# Patient Record
Sex: Male | Born: 1970 | Race: White | Hispanic: No | Marital: Married | Smoking: Never smoker
Health system: Southern US, Community
[De-identification: ages and names within clinical notes are randomized; demographics above are authoritative.]

## PROBLEM LIST (undated history)

## (undated) DIAGNOSIS — E039 Hypothyroidism, unspecified: Secondary | ICD-10-CM

## (undated) DIAGNOSIS — F909 Attention-deficit hyperactivity disorder, unspecified type: Secondary | ICD-10-CM

## (undated) DIAGNOSIS — K429 Umbilical hernia without obstruction or gangrene: Secondary | ICD-10-CM

## (undated) DIAGNOSIS — I82409 Acute embolism and thrombosis of unspecified deep veins of unspecified lower extremity: Secondary | ICD-10-CM

## (undated) HISTORY — PX: NO PAST SURGERIES: SHX2092

## (undated) HISTORY — DX: Hypothyroidism, unspecified: E03.9

## (undated) HISTORY — DX: Acute embolism and thrombosis of unspecified deep veins of unspecified lower extremity: I82.409

---

## 2011-06-27 DIAGNOSIS — I8003 Phlebitis and thrombophlebitis of superficial vessels of lower extremities, bilateral: Secondary | ICD-10-CM

## 2011-06-27 DIAGNOSIS — E038 Other specified hypothyroidism: Secondary | ICD-10-CM

## 2011-06-27 DIAGNOSIS — I82409 Acute embolism and thrombosis of unspecified deep veins of unspecified lower extremity: Secondary | ICD-10-CM

## 2011-06-27 DIAGNOSIS — E063 Autoimmune thyroiditis: Secondary | ICD-10-CM

## 2011-06-27 DIAGNOSIS — I2699 Other pulmonary embolism without acute cor pulmonale: Secondary | ICD-10-CM

## 2011-06-27 HISTORY — DX: Autoimmune thyroiditis: E06.3

## 2011-06-27 HISTORY — DX: Acute embolism and thrombosis of unspecified deep veins of unspecified lower extremity: I82.409

## 2011-06-27 HISTORY — DX: Other specified hypothyroidism: E03.8

## 2011-06-27 HISTORY — DX: Phlebitis and thrombophlebitis of superficial vessels of lower extremities, bilateral: I80.03

## 2011-06-27 HISTORY — DX: Other pulmonary embolism without acute cor pulmonale: I26.99

## 2012-03-25 ENCOUNTER — Other Ambulatory Visit: Payer: Self-pay

## 2012-03-25 ENCOUNTER — Encounter (HOSPITAL_COMMUNITY): Payer: Self-pay | Admitting: Family Medicine

## 2012-03-25 ENCOUNTER — Emergency Department (HOSPITAL_COMMUNITY): Payer: BC Managed Care – PPO

## 2012-03-25 ENCOUNTER — Inpatient Hospital Stay (HOSPITAL_COMMUNITY)
Admission: EM | Admit: 2012-03-25 | Discharge: 2012-03-30 | DRG: 543 | Disposition: A | Discharge status: Home / Self Care | Payer: BC Managed Care – PPO | Attending: Internal Medicine | Admitting: Internal Medicine

## 2012-03-25 DIAGNOSIS — D72829 Elevated white blood cell count, unspecified: Secondary | ICD-10-CM

## 2012-03-25 DIAGNOSIS — I8000 Phlebitis and thrombophlebitis of superficial vessels of unspecified lower extremity: Secondary | ICD-10-CM | POA: Diagnosis present

## 2012-03-25 DIAGNOSIS — Z87891 Personal history of nicotine dependence: Secondary | ICD-10-CM

## 2012-03-25 DIAGNOSIS — I809 Phlebitis and thrombophlebitis of unspecified site: Secondary | ICD-10-CM

## 2012-03-25 DIAGNOSIS — R161 Splenomegaly, not elsewhere classified: Secondary | ICD-10-CM | POA: Diagnosis present

## 2012-03-25 DIAGNOSIS — R21 Rash and other nonspecific skin eruption: Secondary | ICD-10-CM

## 2012-03-25 DIAGNOSIS — I2699 Other pulmonary embolism without acute cor pulmonale: Secondary | ICD-10-CM

## 2012-03-25 DIAGNOSIS — I824Y9 Acute embolism and thrombosis of unspecified deep veins of unspecified proximal lower extremity: Principal | ICD-10-CM | POA: Diagnosis present

## 2012-03-25 DIAGNOSIS — I82419 Acute embolism and thrombosis of unspecified femoral vein: Secondary | ICD-10-CM

## 2012-03-25 DIAGNOSIS — F909 Attention-deficit hyperactivity disorder, unspecified type: Secondary | ICD-10-CM

## 2012-03-25 DIAGNOSIS — I82409 Acute embolism and thrombosis of unspecified deep veins of unspecified lower extremity: Secondary | ICD-10-CM

## 2012-03-25 DIAGNOSIS — I824Z9 Acute embolism and thrombosis of unspecified deep veins of unspecified distal lower extremity: Secondary | ICD-10-CM | POA: Diagnosis present

## 2012-03-25 HISTORY — DX: Attention-deficit hyperactivity disorder, unspecified type: F90.9

## 2012-03-25 LAB — CBC WITH DIFFERENTIAL/PLATELET
Basophils Absolute: 0.1 10*3/uL (ref 0.0–0.1)
Eosinophils Absolute: 0.5 10*3/uL (ref 0.0–0.7)
Eosinophils Relative: 4 % (ref 0–5)
HCT: 39 % (ref 39.0–52.0)
MCH: 28.3 pg (ref 26.0–34.0)
MCHC: 33.6 g/dL (ref 30.0–36.0)
MCV: 84.2 fL (ref 78.0–100.0)
Monocytes Absolute: 1 10*3/uL (ref 0.1–1.0)
Platelets: 373 10*3/uL (ref 150–400)
RDW: 14.5 % (ref 11.5–15.5)
WBC: 12.4 10*3/uL — ABNORMAL HIGH (ref 4.0–10.5)

## 2012-03-25 LAB — COMPREHENSIVE METABOLIC PANEL
ALT: 17 U/L (ref 0–53)
AST: 20 U/L (ref 0–37)
CO2: 26 mEq/L (ref 19–32)
Calcium: 9.6 mg/dL (ref 8.4–10.5)
Creatinine, Ser: 1.18 mg/dL (ref 0.50–1.35)
GFR calc Af Amer: 88 mL/min — ABNORMAL LOW (ref >90)
GFR calc non Af Amer: 76 mL/min — ABNORMAL LOW (ref >90)
Sodium: 137 mEq/L (ref 135–145)
Total Protein: 7.6 g/dL (ref 6.0–8.3)

## 2012-03-25 LAB — TYPE AND SCREEN: Antibody Screen: NEGATIVE

## 2012-03-25 LAB — PROTIME-INR: Prothrombin Time: 14.8 seconds (ref 11.6–15.2)

## 2012-03-25 LAB — APTT: aPTT: 46 seconds — ABNORMAL HIGH (ref 24–37)

## 2012-03-25 LAB — ABO/RH: ABO/RH(D): O POS

## 2012-03-25 MED ORDER — CLONAZEPAM 0.5 MG PO TABS
0.5000 mg | ORAL_TABLET | Freq: Every day | ORAL | Status: DC
  Filled 2012-03-25 (×3): qty 1

## 2012-03-25 MED ORDER — ONDANSETRON HCL 4 MG/2ML IJ SOLN: 4.0000 mg | Freq: Four times a day (QID) | INTRAMUSCULAR | Status: DC | PRN

## 2012-03-25 MED ORDER — SODIUM CHLORIDE 0.9 % IV SOLN
INTRAVENOUS | Status: DC
  Administered 2012-03-26 (×2): via INTRAVENOUS
  Administered 2012-03-27: 75 mL/h via INTRAVENOUS
  Administered 2012-03-27: 23:00:00 via INTRAVENOUS
  Administered 2012-03-28: 1000 mL via INTRAVENOUS
  Administered 2012-03-29 (×2): via INTRAVENOUS

## 2012-03-25 MED ORDER — ACETAMINOPHEN 325 MG PO TABS: 650.0000 mg | ORAL_TABLET | Freq: Four times a day (QID) | ORAL | Status: DC | PRN

## 2012-03-25 MED ORDER — AMPHETAMINE-DEXTROAMPHET ER 10 MG PO CP24
50.0000 mg | ORAL_CAPSULE | Freq: Every day | ORAL | Status: DC
  Filled 2012-03-25: qty 5

## 2012-03-25 MED ORDER — ACETAMINOPHEN 650 MG RE SUPP: 650.0000 mg | Freq: Four times a day (QID) | RECTAL | Status: DC | PRN

## 2012-03-25 MED ORDER — SODIUM CHLORIDE 0.9 % IJ SOLN
3.0000 mL | Freq: Two times a day (BID) | INTRAMUSCULAR | Status: DC
  Administered 2012-03-25 – 2012-03-29 (×3): 3 mL via INTRAVENOUS

## 2012-03-25 MED ORDER — HEPARIN (PORCINE) IN NACL 100-0.45 UNIT/ML-% IJ SOLN
2100.0000 [IU]/h | INTRAMUSCULAR | Status: DC
  Administered 2012-03-25: 1700 [IU]/h via INTRAVENOUS
  Administered 2012-03-26 (×2): 2100 [IU]/h via INTRAVENOUS
  Filled 2012-03-25 (×3): qty 250

## 2012-03-25 MED ORDER — ONDANSETRON HCL 4 MG PO TABS: 4.0000 mg | ORAL_TABLET | Freq: Four times a day (QID) | ORAL | Status: DC | PRN

## 2012-03-25 MED ORDER — IOHEXOL 350 MG/ML SOLN
100.0000 mL | Freq: Once | INTRAVENOUS | Status: AC | PRN
  Administered 2012-03-25: 100 mL via INTRAVENOUS

## 2012-03-25 MED ORDER — HEPARIN BOLUS VIA INFUSION
5000.0000 [IU] | Freq: Once | INTRAVENOUS | Status: AC
  Administered 2012-03-25: 5000 [IU] via INTRAVENOUS

## 2012-03-25 MED ORDER — SODIUM CHLORIDE 0.9 % IV SOLN
INTRAVENOUS | Status: AC
  Administered 2012-03-25: 1000 mL via INTRAVENOUS

## 2012-03-25 NOTE — Consult Note (Signed)
ANTICOAGULATION CONSULT NOTE - Initial Consult  Pharmacy Consult for Heparin Indication: Bilateral DVT and PE  No Known Allergies  Patient Measurements: Height: 6\' 1"  (185.4 cm) Weight: 222 lb (100.699 kg) IBW/kg (Calculated) : 79.9  Heparin Dosing Weight:100 kg  Vital Signs: Temp: 98.6 F (37 C) (09/30 1434) Temp src: Oral (09/30 1434) BP: 112/71 mmHg (09/30 1631) Pulse Rate: 75  (09/30 1631)  Labs:  Basename 03/25/12 1511 03/25/12 1441  HGB -- 13.1  HCT -- 39.0  PLT -- 373  APTT 46* --  LABPROT 14.8 --  INR 1.18 --  HEPARINUNFRC -- --  CREATININE -- 1.18  CKTOTAL -- --  CKMB -- --  TROPONINI -- --    Estimated Creatinine Clearance: 104.9 ml/min (by C-G formula based on Cr of 1.18).   Medical History: Past Medical History  Diagnosis Date  . ADHD (attention deficit hyperactivity disorder)    Assessment: 41yom to begin heparin for bilateral DVT and PE. Baseline CBC and renal function wnl.   Goal of Therapy:  Heparin level 0.3-0.7 units/ml Monitor platelets by anticoagulation protocol: Yes   Plan:  1) Heparin bolus 5000 units x 1 2) Heparin drip at 1700 units/hr 3) 6h heparin level 4) Daily heparin level and CBC  Fredrik Rigger 03/25/2012,8:28 PM

## 2012-03-25 NOTE — H&P (Signed)
Andrew Clayton is an 41 y.o. male.   Patient was seen and examined on March 25, 2012. PCP - Dr. Merri Brunette. Chief Complaint: Lower extremity DVT. HPI: 41 year old male with history of ADHD was referred to the ER after patient's PCP had found patient had DVT. Patient has been having some pain and swelling like knots in his left leg over the last few days. Since it worsened patient had gone to his PCP and Doppler was done which showed DVT. In the ER patient had repeat Dopplers done bilaterally which showed extensive DVT of both lower extremity. ER physician Dr. Ignacia Palma had contacted pulmonary critical care Dr. Tyson Alias who advised patient to have CT angiogram done which shows bilateral pulmonary embolism. Patient at this time is hemodynamically stable. Denies any chest pain or shortness of breath. 2 weeks ago he had noticed some lumps in his left upper extremity. And over the last one year he has been explaining off and on dizziness but denies any loss of consciousness. As the ER physician Dr. Tyson Alias has requested patient to be observed in step down. Patient has been started IV heparin.  Past Medical History  Diagnosis Date  . ADHD (attention deficit hyperactivity disorder)     Past Surgical History  Procedure Date  . No past surgeries     Family History  Problem Relation Age of Onset  . Pulmonary embolism Neg Hx    Social History:  reports that he has never smoked. He does not have any smokeless tobacco history on file. He reports that he does not drink alcohol. His drug history not on file.  Allergies: No Known Allergies   (Not in a hospital admission)  Results for orders placed during the hospital encounter of 03/25/12 (from the past 48 hour(s))  CBC WITH DIFFERENTIAL     Status: Abnormal   Collection Time   03/25/12  2:41 PM      Component Value Range Comment   WBC 12.4 (*) 4.0 - 10.5 K/uL    RBC 4.63  4.22 - 5.81 MIL/uL    Hemoglobin 13.1  13.0 - 17.0 g/dL    HCT 16.1   09.6 - 04.5 %    MCV 84.2  78.0 - 100.0 fL    MCH 28.3  26.0 - 34.0 pg    MCHC 33.6  30.0 - 36.0 g/dL    RDW 40.9  81.1 - 91.4 %    Platelets 373  150 - 400 K/uL    Neutrophils Relative 72  43 - 77 %    Neutro Abs 8.9 (*) 1.7 - 7.7 K/uL    Lymphocytes Relative 16  12 - 46 %    Lymphs Abs 2.0  0.7 - 4.0 K/uL    Monocytes Relative 8  3 - 12 %    Monocytes Absolute 1.0  0.1 - 1.0 K/uL    Eosinophils Relative 4  0 - 5 %    Eosinophils Absolute 0.5  0.0 - 0.7 K/uL    Basophils Relative 1  0 - 1 %    Basophils Absolute 0.1  0.0 - 0.1 K/uL   COMPREHENSIVE METABOLIC PANEL     Status: Abnormal   Collection Time   03/25/12  2:41 PM      Component Value Range Comment   Sodium 137  135 - 145 mEq/L    Potassium 3.9  3.5 - 5.1 mEq/L    Chloride 100  96 - 112 mEq/L    CO2 26  19 -  32 mEq/L    Glucose, Bld 79  70 - 99 mg/dL    BUN 17  6 - 23 mg/dL    Creatinine, Ser 1.61  0.50 - 1.35 mg/dL    Calcium 9.6  8.4 - 09.6 mg/dL    Total Protein 7.6  6.0 - 8.3 g/dL    Albumin 3.8  3.5 - 5.2 g/dL    AST 20  0 - 37 U/L    ALT 17  0 - 53 U/L    Alkaline Phosphatase 70  39 - 117 U/L    Total Bilirubin 0.7  0.3 - 1.2 mg/dL    GFR calc non Af Amer 76 (*) >90 mL/min    GFR calc Af Amer 88 (*) >90 mL/min   PROTIME-INR     Status: Normal   Collection Time   03/25/12  3:11 PM      Component Value Range Comment   Prothrombin Time 14.8  11.6 - 15.2 seconds    INR 1.18  0.00 - 1.49   APTT     Status: Abnormal   Collection Time   03/25/12  3:11 PM      Component Value Range Comment   aPTT 46 (*) 24 - 37 seconds   TYPE AND SCREEN     Status: Normal   Collection Time   03/25/12  3:28 PM      Component Value Range Comment   ABO/RH(D) O POS      Antibody Screen NEG      Sample Expiration 03/28/2012     ABO/RH     Status: Normal   Collection Time   03/25/12  3:28 PM      Component Value Range Comment   ABO/RH(D) O POS      Ct Angio Chest Pe W/cm &/or Wo Cm  03/25/2012  *RADIOLOGY REPORT*  Clinical  Data: Known DVT with chest pain shortness of breath.  CT ANGIOGRAPHY CHEST  Technique:  Multidetector CT imaging of the chest using the standard protocol during bolus administration of intravenous contrast. Multiplanar reconstructed images including MIPs were obtained and reviewed to evaluate the vascular anatomy.  Contrast: OMNIPAQUE IOHEXOL 350 MG/ML SOLN  Comparison: None.  Findings: The study is limited by patient breathing motion throughout image acquisition.  There is no large central pulmonary embolus in the main pulmonary arteries are low or pulmonary arteries.  However tiny apparent filling defects are seen in subsegmental pulmonary arteries to both lower lobes (see posteromedial right lower lobe on image 197 and posterior left lower lobe on image 180, both of series 8).  No thoracic aortic aneurysm.  No dissection of the thoracic aorta. Heart size is normal.  There is no pericardial or pleural effusion.  No axillary R bowel, normal in mediastinal, or hilar lymphadenopathy.  The lung windows show some dependent atelectasis in the lower lobes.  There is a 5 mm pulmonary nodule in the right lower lobe on image 49 of series 7.  Bone windows reveal no worrisome lytic or sclerotic osseous lesions.  IMPRESSION: Tiny filling defects identified and subsegmental pulmonary arteries to both lower lobes, consist with acute pulmonary embolus.  5 mm right lower lobe pulmonary nodule. If the patient is at high risk for bronchogenic carcinoma, follow-up chest CT at 6-12 months is recommended.  If the patient is at low risk for bronchogenic carcinoma, follow-up chest CT at 12 months is recommended.  This recommendation follows the consensus statement: Guidelines for Management of Small Pulmonary Nodules  Detected on CT Scans: A Statement from the Fleischner Society as published in Radiology 2005; 237:395-400.  Critical Value/emergent results were called by telephone at the time of interpretation on 03/25/2012 at 1930  hours by me to Dr. Ignacia Palma, who verbally acknowledged these results.  .   Original Report Authenticated By: ERIC A. MANSELL, M.D.     Review of Systems  Constitutional: Negative.   HENT: Negative.   Eyes: Negative.   Respiratory: Negative.   Cardiovascular: Negative.   Gastrointestinal: Negative.   Genitourinary: Negative.   Musculoskeletal:       Lower extremity pain and swelling.  Skin: Negative.   Neurological: Negative.   Endo/Heme/Allergies: Negative.   Psychiatric/Behavioral: Negative.     Blood pressure 112/71, pulse 75, temperature 98.6 F (37 C), temperature source Oral, resp. rate 15, height 6\' 1"  (1.854 m), weight 100.699 kg (222 lb), SpO2 98.00%. Physical Exam  Constitutional: He is oriented to person, place, and time. He appears well-developed and well-nourished. No distress.  HENT:  Head: Normocephalic and atraumatic.  Right Ear: External ear normal.  Left Ear: External ear normal.  Nose: Nose normal.  Mouth/Throat: Oropharynx is clear and moist. No oropharyngeal exudate.  Eyes: Conjunctivae normal are normal. Pupils are equal, round, and reactive to light. Right eye exhibits no discharge. Left eye exhibits no discharge. No scleral icterus.  Neck: Normal range of motion. Neck supple.  Cardiovascular: Normal rate and regular rhythm.   Respiratory: Effort normal and breath sounds normal. No respiratory distress. He has no wheezes. He has no rales.  GI: Soft. Bowel sounds are normal. He exhibits no distension. There is no tenderness. There is no rebound.  Musculoskeletal:       Erythema in lower extremity more on the left side.  Neurological: He is alert and oriented to person, place, and time.       Moves all extremities.  Skin: He is not diaphoretic. There is erythema.  Psychiatric: His behavior is normal.     Assessment/Plan #1. Bilateral pulmonary embolism with extensive DVT of lower extremity - patient has been started on IV heparin. If patient continues to  be hemodynamically stable may change to  xarelto. At this time is we do not know the exact cause for his DVT and PE but will need further workup as outpatient through his primary care. #2. Pulmonary nodule - repeat CT chest in 6-12 months as recommended by radiologist.  CODE STATUS - full code.  Eduard Clos 03/25/2012, 8:31 PM

## 2012-03-25 NOTE — ED Provider Notes (Signed)
History     CSN: [id number]  Arrival date & time 03/25/12  1430   First MD Initiated Contact with Patient 03/25/12 1503      Chief Complaint  Patient presents with  . DVT    (Consider location/radiation/quality/duration/timing/severity/associated sxs/prior treatment) HPI Comments: It is a 41 year old man has been having pain in his arms and legs for several weeks on and off. It got worse last Thursday, 4 days ago, when he developed pain in the left calf and left thigh. Today he saw his internist, Merri Brunette M.D., who ordered a DVT study which was positive. He was therefore sent to [facility] [place] for further evaluation and treatment.  Patient is a 41 y.o. male presenting with leg pain. The history is provided by the patient. No language interpreter was used.  Leg Pain  The incident occurred more than 2 days ago. There was no injury mechanism. The pain is present in the left thigh (Left calf). The quality of the pain is described as aching. The pain is moderate. The pain has been constant since onset. He reports no foreign bodies present. He has tried nothing for the symptoms.    History reviewed. No pertinent past medical history.  History reviewed. No pertinent past surgical history.  History reviewed. No pertinent family history.  History  Substance Use Topics  . Smoking status: Never Smoker   . Smokeless tobacco: Not on file  . Alcohol Use: No      Review of Systems  Constitutional: Negative.  Negative for fever and chills.  HENT: Negative.   Eyes: Negative.   Respiratory: Negative.   Cardiovascular: Negative.   Gastrointestinal: Negative.   Genitourinary: Negative.   Musculoskeletal:       Pain in left calf and left thigh.  Neurological: Negative.   Hematological: Negative for adenopathy. Does not bruise/bleed easily.  Psychiatric/Behavioral: Negative.     Allergies  Review of patient's allergies indicates no known allergies.  Home Medications   Current  Outpatient Rx  Name Route Sig Dispense Refill  . AMPHETAMINE-DEXTROAMPHET ER 25 MG PO CP24 Oral Take 50 mg by mouth daily.    Marland Kitchen CLONAZEPAM 0.5 MG PO TABS Oral Take 0.5 mg by mouth daily.    . IBUPROFEN 800 MG PO TABS Oral Take 800 mg by mouth every 8 (eight) hours as needed. For pain      BP 118/64  Pulse 88  Temp 98.6 F (37 C) (Oral)  Resp 16  SpO2 99%  Physical Exam  Nursing note and vitals reviewed. Constitutional: He appears well-developed and well-nourished. No distress.  HENT:  Head: Normocephalic and atraumatic.  Right Ear: External ear normal.  Left Ear: External ear normal.  Mouth/Throat: Oropharynx is clear and moist.  Eyes: Conjunctivae normal and EOM are normal. Pupils are equal, round, and reactive to light.  Neck: Normal range of motion. Neck supple.  Cardiovascular: Normal rate, regular rhythm and normal heart sounds.   Pulmonary/Chest: Effort normal and breath sounds normal.  Abdominal: Soft. Bowel sounds are normal.  Musculoskeletal: Normal range of motion.       He localizes pain to the left medial thigh, along the course of Hunter's canal.  There is no redness, warmth or point of tenderness.   Skin: Skin is warm and dry.  Psychiatric: He has a normal mood and affect. His behavior is normal.    ED Course  CRITICAL CARE Performed by: Osvaldo Human Authorized by: Osvaldo Human Total critical care time: 37  minutes Critical care was necessary to treat or prevent imminent or life-threatening deterioration of the following conditions: Evaluation and treatment of pt with bilateral lower leg deep venous thrombosis and bilateral lower lobe pulmonary emboli.  Critical care was time spent personally by me on the following activities: discussions with consultants, evaluation of patient's response to treatment, examination of patient, obtaining history from patient or surrogate, ordering and performing treatments and interventions, ordering and review of  laboratory studies, ordering and review of radiographic studies and re-evaluation of patient's condition.   (including critical care time)   Labs Reviewed  CBC WITH DIFFERENTIAL  COMPREHENSIVE METABOLIC PANEL  TYPE AND SCREEN  PROTIME-INR  APTT   3:10 PM Pt seen --> physical exam performed.  Lab workup ordered.  Griffin Basil, RN, DVT study nurse, introduced to pt to see if he wanted to be in a participant in a DVT study.    5:20 PM Results for orders placed during the hospital encounter of 03/25/12  CBC WITH DIFFERENTIAL      Component Value Range   WBC 12.4 (*) 4.0 - 10.5 K/uL   RBC 4.63  4.22 - 5.81 MIL/uL   Hemoglobin 13.1  13.0 - 17.0 g/dL   HCT 16.1  09.6 - 04.5 %   MCV 84.2  78.0 - 100.0 fL   MCH 28.3  26.0 - 34.0 pg   MCHC 33.6  30.0 - 36.0 g/dL   RDW 40.9  81.1 - 91.4 %   Platelets 373  150 - 400 K/uL   Neutrophils Relative 72  43 - 77 %   Neutro Abs 8.9 (*) 1.7 - 7.7 K/uL   Lymphocytes Relative 16  12 - 46 %   Lymphs Abs 2.0  0.7 - 4.0 K/uL   Monocytes Relative 8  3 - 12 %   Monocytes Absolute 1.0  0.1 - 1.0 K/uL   Eosinophils Relative 4  0 - 5 %   Eosinophils Absolute 0.5  0.0 - 0.7 K/uL   Basophils Relative 1  0 - 1 %   Basophils Absolute 0.1  0.0 - 0.1 K/uL  COMPREHENSIVE METABOLIC PANEL      Component Value Range   Sodium 137  135 - 145 mEq/L   Potassium 3.9  3.5 - 5.1 mEq/L   Chloride 100  96 - 112 mEq/L   CO2 26  19 - 32 mEq/L   Glucose, Bld 79  70 - 99 mg/dL   BUN 17  6 - 23 mg/dL   Creatinine, Ser 7.82  0.50 - 1.35 mg/dL   Calcium 9.6  8.4 - 95.6 mg/dL   Total Protein 7.6  6.0 - 8.3 g/dL   Albumin 3.8  3.5 - 5.2 g/dL   AST 20  0 - 37 U/L   ALT 17  0 - 53 U/L   Alkaline Phosphatase 70  39 - 117 U/L   Total Bilirubin 0.7  0.3 - 1.2 mg/dL   GFR calc non Af Amer 76 (*) >90 mL/min   GFR calc Af Amer 88 (*) >90 mL/min  TYPE AND SCREEN      Component Value Range   ABO/RH(D) O POS     Antibody Screen PENDING     Sample Expiration 03/28/2012      PROTIME-INR      Component Value Range   Prothrombin Time 14.8  11.6 - 15.2 seconds   INR 1.18  0.00 - 1.49  APTT      Component Value Range  aPTT 46 (*) 24 - 37 seconds     Date: 03/25/2012  Rate: 90  Rhythm: normal sinus rhythm  QRS Axis: normal  Intervals: normal  ST/T Wave abnormalities: normal  Conduction Disutrbances:none  Narrative Interpretation: Normal EKG  Old EKG Reviewed: none available  5:50 PM Pt seen by Micah Flesher of PCCM, who advised that pt needs to have repeat venous dopplers of both legs, IVC study for possible clot, and CT angio of chest to check for pulmonary embolism.  He will ultimately need admission to stepdown unit.  7:37 PM Report from Kennith Center, M.D., radiologist, says pt has small bilateral lower lobe pulmonary emboli.  Will call [facility] to admit him.    8:04 PM Case discussed with Dr. Toniann Fail.  Admit to Team 1 to [facility] unit, as advised Dr. Tyson Alias of Pulmonary and Critical Care Medicine.  1. Deep venous thrombosis of femoral vein   2. Pulmonary embolism           Carleene Cooper III, MD 03/25/12 2009     Carleene Cooper III, MD 03/25/12 2021

## 2012-03-25 NOTE — ED Notes (Signed)
Family at bedside. 

## 2012-03-25 NOTE — ED Notes (Signed)
Pt sent here for positive DVT in left leg.

## 2012-03-25 NOTE — Progress Notes (Signed)
VASCULAR LAB PRELIMINARY  PRELIMINARY  PRELIMINARY  PRELIMINARY  Bilateral lower extremity venous duplex completed.    Preliminary report:  Bilateral -  Positive for extensive DVT noted in the posterior tibial, popliteal, femoral, common femoral, and illiac veins. Also noted is a DVT of the right peroneal and left profunda vein. Positive for a superficial thrombus coursing throughout the entire right greater saphenous vein and graters saphenous vein of the left lower leg. Superficial thrombus noted in several smaller veins of the left thigh. All areas of thrombus indicate minimal to minute flow.  Kytzia Gienger, RVS 03/25/2012, 7:46 PM

## 2012-03-26 ENCOUNTER — Inpatient Hospital Stay (HOSPITAL_COMMUNITY): Payer: BC Managed Care – PPO

## 2012-03-26 ENCOUNTER — Encounter (HOSPITAL_COMMUNITY): Payer: Self-pay | Admitting: Radiology

## 2012-03-26 DIAGNOSIS — I824Z9 Acute embolism and thrombosis of unspecified deep veins of unspecified distal lower extremity: Secondary | ICD-10-CM

## 2012-03-26 DIAGNOSIS — F909 Attention-deficit hyperactivity disorder, unspecified type: Secondary | ICD-10-CM | POA: Diagnosis present

## 2012-03-26 DIAGNOSIS — R21 Rash and other nonspecific skin eruption: Secondary | ICD-10-CM | POA: Diagnosis present

## 2012-03-26 DIAGNOSIS — I809 Phlebitis and thrombophlebitis of unspecified site: Secondary | ICD-10-CM | POA: Diagnosis present

## 2012-03-26 DIAGNOSIS — I8010 Phlebitis and thrombophlebitis of unspecified femoral vein: Secondary | ICD-10-CM

## 2012-03-26 DIAGNOSIS — D72829 Elevated white blood cell count, unspecified: Secondary | ICD-10-CM | POA: Diagnosis present

## 2012-03-26 DIAGNOSIS — I2699 Other pulmonary embolism without acute cor pulmonale: Secondary | ICD-10-CM

## 2012-03-26 DIAGNOSIS — I82819 Embolism and thrombosis of superficial veins of unspecified lower extremities: Secondary | ICD-10-CM

## 2012-03-26 LAB — COMPREHENSIVE METABOLIC PANEL
AST: 23 U/L (ref 0–37)
Albumin: 3.3 g/dL — ABNORMAL LOW (ref 3.5–5.2)
Alkaline Phosphatase: 67 U/L (ref 39–117)
BUN: 16 mg/dL (ref 6–23)
CO2: 25 mEq/L (ref 19–32)
Chloride: 101 mEq/L (ref 96–112)
Creatinine, Ser: 1.18 mg/dL (ref 0.50–1.35)
GFR calc non Af Amer: 76 mL/min — ABNORMAL LOW (ref >90)
Potassium: 4.2 mEq/L (ref 3.5–5.1)
Total Bilirubin: 0.6 mg/dL (ref 0.3–1.2)

## 2012-03-26 LAB — HOMOCYSTEINE: Homocysteine: 12.9 umol/L (ref 4.0–15.4)

## 2012-03-26 LAB — CBC
HCT: 35.9 % — ABNORMAL LOW (ref 39.0–52.0)
MCV: 83.5 fL (ref 78.0–100.0)
Platelets: 374 10*3/uL (ref 150–400)
RBC: 4.3 MIL/uL (ref 4.22–5.81)
RDW: 14.5 % (ref 11.5–15.5)
WBC: 10.7 10*3/uL — ABNORMAL HIGH (ref 4.0–10.5)

## 2012-03-26 LAB — MRSA PCR SCREENING: MRSA by PCR: NEGATIVE

## 2012-03-26 LAB — TROPONIN I: Troponin I: <0.3 ng/mL (ref <0.30)

## 2012-03-26 LAB — OCCULT BLOOD X 1 CARD TO LAB, STOOL: Fecal Occult Bld: NEGATIVE

## 2012-03-26 LAB — SEDIMENTATION RATE: Sed Rate: 60 mm/hr — ABNORMAL HIGH (ref 0–16)

## 2012-03-26 LAB — HEPARIN LEVEL (UNFRACTIONATED): Heparin Unfractionated: <0.1 IU/mL — ABNORMAL LOW (ref 0.30–0.70)

## 2012-03-26 MED ORDER — HEPARIN BOLUS VIA INFUSION
2000.0000 [IU] | Freq: Once | INTRAVENOUS | Status: AC
  Administered 2012-03-26: 2000 [IU] via INTRAVENOUS
  Filled 2012-03-26: qty 2000

## 2012-03-26 MED ORDER — IOHEXOL 300 MG/ML  SOLN
100.0000 mL | Freq: Once | INTRAMUSCULAR | Status: AC | PRN
  Administered 2012-03-26: 100 mL via INTRAVENOUS

## 2012-03-26 MED ORDER — HEPARIN (PORCINE) IN NACL 100-0.45 UNIT/ML-% IJ SOLN
2900.0000 [IU]/h | INTRAMUSCULAR | Status: AC
  Administered 2012-03-26: 2350 [IU]/h via INTRAVENOUS
  Administered 2012-03-26 – 2012-03-27 (×2): 2500 [IU]/h via INTRAVENOUS
  Administered 2012-03-27 (×2): 2700 [IU]/h via INTRAVENOUS
  Administered 2012-03-28 – 2012-03-29 (×3): 2900 [IU]/h via INTRAVENOUS
  Filled 2012-03-26 (×8): qty 250

## 2012-03-26 MED ORDER — WARFARIN VIDEO: Freq: Once | Status: DC

## 2012-03-26 MED ORDER — POLYETHYLENE GLYCOL 3350 17 G PO PACK
17.0000 g | PACK | Freq: Every day | ORAL | Status: DC
  Administered 2012-03-26 – 2012-03-29 (×4): 17 g via ORAL
  Filled 2012-03-26 (×5): qty 1

## 2012-03-26 MED ORDER — INFLUENZA VIRUS VACC SPLIT PF IM SUSP
0.5000 mL | INTRAMUSCULAR | Status: AC
  Administered 2012-03-27: 0.5 mL via INTRAMUSCULAR
  Filled 2012-03-26: qty 0.5

## 2012-03-26 MED ORDER — IOHEXOL 300 MG/ML  SOLN
20.0000 mL | INTRAMUSCULAR | Status: AC
  Administered 2012-03-26 (×2): 20 mL via ORAL

## 2012-03-26 MED ORDER — WARFARIN SODIUM 10 MG PO TABS
10.0000 mg | ORAL_TABLET | Freq: Once | ORAL | Status: DC
  Filled 2012-03-26: qty 1

## 2012-03-26 MED ORDER — PATIENT'S GUIDE TO USING COUMADIN BOOK
Freq: Once | Status: DC
  Filled 2012-03-26: qty 1

## 2012-03-26 MED ORDER — WARFARIN - PHARMACIST DOSING INPATIENT: Freq: Every day | Status: DC

## 2012-03-26 MED ORDER — HEPARIN BOLUS VIA INFUSION
3000.0000 [IU] | Freq: Once | INTRAVENOUS | Status: AC
  Administered 2012-03-26: 3000 [IU] via INTRAVENOUS
  Filled 2012-03-26: qty 3000

## 2012-03-26 NOTE — Progress Notes (Signed)
Report called to Ty, receiving RN on [id number].  Per Dr. Dalene Carrow, pt to be on bedrest for 48 hours.  Pt transferred to [id number] via bed with wife at pt's side.    Roselie Awkward, RN

## 2012-03-26 NOTE — Progress Notes (Signed)
Patient with hard, formed BM and moderate amount of bright red blood. Hemoccult sent to lab, Dr Butler Denmark made aware. Orders to monitor bleeding only at this time, notify MD if amount of bleeding increases or change in patient status. Will continue to monitor.

## 2012-03-26 NOTE — Progress Notes (Signed)
Utilization review completed.  
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
## 2012-03-26 NOTE — Progress Notes (Signed)
ANTICOAGULATION CONSULT NOTE - Follow Up Consult  Pharmacy Consult for heparin Indication: pulmonary embolus and DVT  Labs:  Basename 03/26/12 2013 03/26/12 1044 03/26/12 1043 03/26/12 0255 03/25/12 2236 03/25/12 1511 03/25/12 1441  HGB -- -- -- 12.3* -- -- 13.1  HCT -- -- -- 35.9* -- -- 39.0  PLT -- -- -- 374 -- -- 373  APTT -- -- -- -- -- 46* --  LABPROT -- -- -- -- -- 14.8 --  INR -- -- -- -- -- 1.18 --  HEPARINUNFRC 0.35 0.25* -- <0.10* -- -- --  CREATININE -- -- -- 1.18 -- -- 1.18  CKTOTAL -- -- -- 54 -- -- --  CKMB -- -- -- -- -- -- --  TROPONINI -- -- <0.30 <0.30 <0.30 -- --    Assessment: 41yo male  on heparin for DVT and PE. His heparin level is now at the lower end of the therapeutic range after multiple rate adjustments.  Given extensive thrombosis I would prefer for his Heparin level to be closer to 0.5.  Plans noted to transition to Xarelto on 10/3.   Goal of Therapy:  Heparin level 0.3-0.7 units/ml   Plan:  Increase Heparin to 2500 units/hr Recheck Heparin level in 6 hours.  Estella Husk, Pharm.D., BCPS Clinical Pharmacist  Phone [phone] Pager [phone] 03/26/2012, 9:28 PM

## 2012-03-26 NOTE — Progress Notes (Signed)
ANTICOAGULATION CONSULT NOTE - Follow Up Consult  Pharmacy Consult for heparin Indication: pulmonary embolus and DVT  Labs:  Basename 03/26/12 1044 03/26/12 1043 03/26/12 0255 03/25/12 2236 03/25/12 1511 03/25/12 1441  HGB -- -- 12.3* -- -- 13.1  HCT -- -- 35.9* -- -- 39.0  PLT -- -- 374 -- -- 373  APTT -- -- -- -- 46* --  LABPROT -- -- -- -- 14.8 --  INR -- -- -- -- 1.18 --  HEPARINUNFRC 0.25* -- <0.10* -- -- --  CREATININE -- -- 1.18 -- -- 1.18  CKTOTAL -- -- 54 -- -- --  CKMB -- -- -- -- -- --  TROPONINI -- <0.30 <0.30 <0.30 -- --    Assessment: 41yo male  on heparin for DVT and PE. Level now is 0.25 so slightly subtherapeutic. Will rebolus and increase rate to get to goal. After discussion with Dr. Dalene Carrow, cont heparin for 48hrs then decide whether xarelto or lovenox. Depends on insurance coverage.   Goal of Therapy:  Heparin level 0.3-0.7 units/ml   Plan:   Heparin bolus 2000 units x1 Increase heparin to 2350 units/hr F/u 6hr heparin level

## 2012-03-26 NOTE — Progress Notes (Signed)
ANTICOAGULATION CONSULT NOTE - Follow Up Consult  Pharmacy Consult for heparin Indication: pulmonary embolus and DVT  Labs:  Basename 03/26/12 1044 03/26/12 1043 03/26/12 0255 03/25/12 2236 03/25/12 1511 03/25/12 1441  HGB -- -- 12.3* -- -- 13.1  HCT -- -- 35.9* -- -- 39.0  PLT -- -- 374 -- -- 373  APTT -- -- -- -- 46* --  LABPROT -- -- -- -- 14.8 --  INR -- -- -- -- 1.18 --  HEPARINUNFRC 0.25* -- <0.10* -- -- --  CREATININE -- -- 1.18 -- -- 1.18  CKTOTAL -- -- 54 -- -- --  CKMB -- -- -- -- -- --  TROPONINI -- <0.30 <0.30 <0.30 -- --    Assessment: 41yo male  on heparin for DVT and PE. Level now is 0.25 so slightly subtherapeutic. Will rebolus and increase rate to get to goal  Goal of Therapy:  Heparin level 0.3-0.7 units/ml   Plan:   Heparin bolus 2000 units x1 Increase heparin to 2350 units/hr F/u 6hr heparin level

## 2012-03-26 NOTE — Consult Note (Signed)
ONCOLOGY  HOSPITAL CONSULTATION NOTE  Andrew Clayton                                MR#: [id number]  DOB: 10-Jul-1970                       CSN#: [id number]  Referring MD: [facility]  Primary MD: Dr.W. Renne Crigler  Reason for Consult: r/o Hypercoagulable state  DVT / PE   Findings:Andrew Clayton is a 41 y.o. white male admitted on 03/25/2012 after presenting to his PCP with 4 day history of LLE pain and swelling following the appearance of LLE "lumps" two weeks prior, and found to have DVT as outpatient. He was sent to the ED,with repeat dopplers revealing extensive DVT in  posterior tibial, popliteal, femoral, common femoral, and iliac veins. In addition  DVT of the right peroneal and left profunda vein were noted. Superficial thrombus throughout the entire right greater saphenous vein and greater saphenous vein of the left lower leg and superficial thrombus in several smaller veins of the left thigh were seen. CT angio of the chest with contrast on 03/25/12  showed tiny filling defects and subsegmental pulmonary arteries to both lower lobes, consisting with acute pulmonary embolus. Incidental  5 mm right lower lobe pulmonary nodule was identified.Patient did not have any other cardiac or respiratory complaints.   Heparin per pharmacy was initiated at 8:30 pm with 5000 units bolus and then a drip at 1700 cc/hr. He is now on Heparin drip at 2100 units /hr after a bolus of 3000 units due to undetectable heparin levels after initiation.  On admission his PT/ INR  Were 14.8 and 1.18 respectively with  PTT at 46.  Platelets were normal at 373 k.  Hypercoagulable panel drawn on 10 /01 /2013 after anticoagulation is pending.  2 D echo is pending.            No family history of hematological disorders.He states that in the past, he was told to have "thicker blood", and his "good cholesterol is low".He does not take ASA. He takes occasionally NSAIDs. Patient states that has never had a hematological evaluation  prior to this admission.No recent surgeries. No recent trauma. He drives longs distances for work, including non stop trips to [place] and [place]. He works in an office, sedentary lifestyle.  Denies prior PE/DVT history.  We were kindly asked to see the patient with recommendations. He is Full Code.  PMH:  Past Medical History  Diagnosis Date  . ADHD (attention deficit hyperactivity disorder)     Surgeries:  Past Surgical History  Procedure Date  . No past surgeries     Allergies: No Known Allergies  Medications:   Prior to Admission:  Prescriptions prior to admission  Medication Sig Dispense Refill  . amphetamine-dextroamphetamine (ADDERALL XR) 25 MG 24 hr capsule Take 50 mg by mouth daily.      . clonazePAM (KLONOPIN) 0.5 MG tablet Take 0.5 mg by mouth daily.      Marland Kitchen ibuprofen (ADVIL,MOTRIN) 800 MG tablet Take 800 mg by mouth every 8 (eight) hours as needed. For pain        NWG:[id number], acetaminophen, iohexol, ondansetron (ZOFRAN) IV, ondansetron  ROS: Constitutional: Negative for weight loss. Negative for fever, chills or  night sweats. Negative for  fatigue.  Eyes: Negative for blurred vision and double vision.  Respiratory: Negative for cough. No hemoptysis.  Shortness of breath. No pleuritic chest pain.  Cardiovascular: Negative for chest pain. No palpitations.  GI: Negative for  nausea, vomiting, diarrhea or constipation. No change in bowel caliber. No  Melena or hematochezia. No abdominal pain.  GU: Negative for hematuria. No loss of urinary control.No urinary retention. Musculoskeletal: No calf tenderness at this time. He did have symptoms 4 days prior to admission, constant, aching, for which he sought medical attention. Swelling of the Lower extremities prior to admission, now improved.  Skin: Negative for itching. No rash. No petechia. No easy bruising. He has noticed "skin bumps" (?cord) on all extremities 2 weeks prior to admission, with reproducible  tenderness.  Neurological: No headaches. No motor or sensory deficits. Musculoskeletal: Chronic lower back pain.  Psych: He did notice increased anxiety over the last 2 weeks.   Family History:    Family History  Problem Relation Age of Onset  . Pulmonary embolism Neg Hx    Father and Paternal Grandfather deceased with lung cancer.  No family history of bleeding disorders. No family history of strokes, PE or DVT.   Social History: Married with 2 children ages 45 and 93. Lives in [place]. 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  He is a previous smoker, and smoked 1/2  ppd for 2  Yrs in his 34's . He denies alcohol or recreational drug use.   Physical Exam    Filed Vitals:   03/26/12 0800  BP: 117/71  Pulse: 81  Temp: 98.3 F (36.8 C)  Resp: 14      Patient is in no acute distress.  He is alert and orientated x 3. He is well developed, well nourished.   HEENT: Normocephalic, atraumatic, PERRLA. Oral cavity without thrush or lesions. Neck supple. no thyromegaly, no cervical or supraclavicular adenopathy  Lungs clear bilaterally . No wheezing, rhonchi or rales. No axillary masses. Cardiac regular rate and rhythm normal S1-S2, no murmurs, rubs or gallops Abdomen - soft nontender,bowel sounds x4. No HSM. No masses palpable.  GU/rectal: deferred. Extremities no clubbing cyanosis. No pitting edema. Tender in the left inner thigh.   Skin: There is no bruising or petechial rash Musculoskeletal: No spinal tenderness.  Neuro: Non Focal  Labs:  CBC   Lab 03/26/12 0255 03/25/12 1441  WBC 10.7* 12.4*  HGB 12.3* 13.1  HCT 35.9* 39.0  PLT 374 373  MCV 83.5 84.2  MCH 28.6 28.3  MCHC 34.3 33.6  RDW 14.5 14.5  LYMPHSABS -- 2.0  MONOABS -- 1.0  EOSABS -- 0.5  BASOSABS -- 0.1  BANDABS -- --     CMP    Lab 03/26/12 0255 03/25/12 1441  NA 137 137  K 4.2 3.9  CL 101 100  CO2 25 26  GLUCOSE 110* 79  BUN 16 17  CREATININE 1.18 1.18  CALCIUM 9.2 9.6  MG --  --  AST 23 20  ALT 16 17  ALKPHOS 67 70  BILITOT 0.6 0.7        Component Value Date/Time   BILITOT 0.6 03/26/2012 0255      Lab 03/25/12 1511  INR 1.18  PROTIME --     IMAGING STUDIES:  Ct Angio Chest Pe W/cm &/or Wo Cm  03/25/2012   Comparison: None.  Findings: The study is limited by patient breathing motion throughout image acquisition.  There is no large central pulmonary embolus in the main pulmonary arteries are low or pulmonary arteries.  However tiny apparent filling defects are seen  in subsegmental pulmonary arteries to both lower lobes (see posteromedial right lower lobe on image 197 and posterior left lower lobe on image 180, both of series 8).  No thoracic aortic aneurysm.  No dissection of the thoracic aorta. Heart size is normal.  There is no pericardial or pleural effusion.  No axillary R bowel, normal in mediastinal, or hilar lymphadenopathy.  The lung windows show some dependent atelectasis in the lower lobes.  There is a 5 mm pulmonary nodule in the right lower lobe on image 49 of series 7.  Bone windows reveal no worrisome lytic or sclerotic osseous lesions.  IMPRESSION: Tiny filling defects identified and subsegmental pulmonary arteries to both lower lobes, consist with acute pulmonary embolus.  5 mm right lower lobe pulmonary nodule. If the patient is at high risk for bronchogenic carcinoma, follow-up chest CT at 6-12 months is recommended.  If the patient is at low risk for bronchogenic carcinoma, follow-up chest CT at 12 months is recommended.  This recommendation follows the consensus statement: Guidelines for Management of Small Pulmonary Nodules Detected on CT Scans: A Statement from the Fleischner Society as published in Radiology 2005; 237:395-400.     Bilateral lower extremity venous duplex completed.  Bilateral - Positive for extensive DVT noted in the posterior tibial, popliteal, femoral, common femoral, and illiac veins. Also noted is a DVT of the right  peroneal and left profunda vein. Positive for a superficial thrombus coursing throughout the entire right greater saphenous vein and graters saphenous vein of the left lower leg. Superficial thrombus noted in several smaller veins of the left thigh. All areas of thrombus indicate minimal to minute flow.      A/P: 41 y.o. male found to have bilateral  DVT and PE,currently on Heparin. Hypercoagulable panel taken after Heparinization is currently pending.   Dr.Jerah Esty is to see the patient following this consult with recommendations regarding diagnosis and  further workup studies.  Thank you for the referral.  [facility] 03/26/2012 12:11 PM   I have seen and examined the patient and agree with above.  Laurice Record., M.D.    ATTENDING ADDENDUM: The patient is a 41 year old man with extensive thrombosis involving both lower extremities and bilateral pulmonary embolism. History is not suggestive of a hereditary component.  Does not have a past medical history for any major illnesses. Has experienced some rectal bleeding with staining of stool. His paternal grandfather and father had lung cancer. The CT angiogram indicates a small 5 mm pulmonary nodule.  CT scan of the abdomen and pelvis will be scheduled to rule out malignancy. We'll also include CEA level. We await results of hypercoagulable panel. In interim the patient is to continue with IV heparin for at least 48-72 hours. I would prefer that he is bridged to either Lovenox or Xarelto as an outpatient regimen.   Ryli Standlee I.

## 2012-03-26 NOTE — Progress Notes (Signed)
Pharmacy Communication Regarding Heparin + Xarelto  Orders were received to transition patient to Xarelto from the heparin drip. Per Dr. Dalene Carrow, the patient needs to continue in IV heparin for ~48-72 hours. Currently the patient has only been on the drip since 9/30 at 2100 (<24 hours). Will plan to transition to Xarelto after at least 48 hours on the heparin drip -- likely 10/3 a.m with breakfast (~60 hours on drip).   The patient and his wife were educated on Xarelto today. Questions were addressed and answered.  Will continue heparin at current drip rate for now. The next heparin level is due to be checked at 1900.  Georgina Pillion, PharmD, BCPS Clinical Pharmacist Pager: [phone] 03/26/2012 3:56 PM

## 2012-03-26 NOTE — Progress Notes (Signed)
ANTICOAGULATION CONSULT NOTE - Follow Up Consult  Pharmacy Consult for heparin Indication: pulmonary embolus and DVT  Labs:  Basename 03/26/12 0255 03/25/12 2236 03/25/12 1511 03/25/12 1441  HGB 12.3* -- -- 13.1  HCT 35.9* -- -- 39.0  PLT 374 -- -- 373  APTT -- -- 46* --  LABPROT -- -- 14.8 --  INR -- -- 1.18 --  HEPARINUNFRC <0.10* -- -- --  CREATININE 1.18 -- -- 1.18  CKTOTAL 54 -- -- --  CKMB -- -- -- --  TROPONINI <0.30 <0.30 -- --    Assessment: 41yo male undetectable on heparin with initial dosing for DVT and PE.  Goal of Therapy:  Heparin level 0.3-0.7 units/ml   Plan:  Will rebolus with 3000 units of heparin and increase gtt by 4 units/kg/hr to 2100 units/hr and check level in 6hr.  Colleen Can PharmD BCPS 03/26/2012,6:02 AM

## 2012-03-26 NOTE — Progress Notes (Signed)
  Echocardiogram 2D Echocardiogram has been performed.  Andrew Clayton 03/26/2012, 12:05 PM

## 2012-03-26 NOTE — Progress Notes (Signed)
Patient called RN into room d/t new development of reddened area on RUE (bicep). Patient states this came up today. Area is approx. 2 inches x 1 inch in diameter, cool, red, painful to touch, with a hard knot in center. Pt states these are the same thing he's been having in his lower extremities (superficial thrombus?). Pt also requesting stool softener d/t hard stool with straining and blood with bowel movement. MD notified, will continue to monitor.

## 2012-03-27 DIAGNOSIS — I824Y9 Acute embolism and thrombosis of unspecified deep veins of unspecified proximal lower extremity: Principal | ICD-10-CM

## 2012-03-27 LAB — LUPUS ANTICOAGULANT PANEL
PTT Lupus Anticoagulant: 112.2 secs — ABNORMAL HIGH (ref 28.0–43.0)
PTTLA Confirmation: 20.9 secs — ABNORMAL HIGH (ref <8.0)

## 2012-03-27 LAB — FACTOR 5 LEIDEN

## 2012-03-27 LAB — CBC
MCV: 84.3 fL (ref 78.0–100.0)
Platelets: 315 10*3/uL (ref 150–400)
RBC: 4.47 MIL/uL (ref 4.22–5.81)
RDW: 14.5 % (ref 11.5–15.5)
WBC: 10.7 10*3/uL — ABNORMAL HIGH (ref 4.0–10.5)

## 2012-03-27 LAB — BETA-2-GLYCOPROTEIN I ABS, IGG/M/A
Beta-2 Glyco I IgG: 0 G Units (ref <20)
Beta-2-Glycoprotein I IgM: 13 M Units (ref <20)

## 2012-03-27 LAB — HEPARIN LEVEL (UNFRACTIONATED): Heparin Unfractionated: 0.61 IU/mL (ref 0.30–0.70)

## 2012-03-27 LAB — PROTEIN S, TOTAL: Protein S Ag, Total: 97 % (ref 60–150)

## 2012-03-27 LAB — PROTHROMBIN GENE MUTATION

## 2012-03-27 LAB — CARDIOLIPIN ANTIBODIES, IGG, IGM, IGA: Anticardiolipin IgG: 10 GPL U/mL — ABNORMAL LOW (ref <23)

## 2012-03-27 LAB — PROTEIN C, TOTAL: Protein C, Total: 77 % (ref 72–160)

## 2012-03-27 LAB — CEA: CEA: <0.5 ng/mL (ref 0.0–5.0)

## 2012-03-27 NOTE — Progress Notes (Signed)
SUBJECTIVE:  Pt. offers no current complaints.  He specifically denies chest pain, cough or bleeding. I reviewed the results of recent CT and blood work with the patient.   MEDICATIONS:  Scheduled:   . amphetamine-dextroamphetamine  50 mg Oral Daily  . clonazePAM  0.5 mg Oral Daily  . heparin  2,000 Units Intravenous Once  . influenza  inactive virus vaccine  0.5 mL Intramuscular Tomorrow-1000  . iohexol  20 mL Oral Q1 Hr x 2  . polyethylene glycol  17 g Oral Daily  . sodium chloride  3 mL Intravenous Q12H  . DISCONTD: patient's guide to using coumadin book   Does not apply Once  . DISCONTD: warfarin  10 mg Oral ONCE-1800  . DISCONTD: warfarin   Does not apply Once  . DISCONTD: Warfarin - Pharmacist Dosing Inpatient   Does not apply q1800   Continuous:   . sodium chloride 75 mL/hr at 03/26/12 2058  . heparin 2,700 Units/hr (03/27/12 0729)  . DISCONTD: heparin 2,100 Units/hr (03/26/12 0836)   WUJ:[id number], acetaminophen, iohexol, ondansetron (ZOFRAN) IV, ondansetron   PHYSICAL EXAM  Vital signs in last 24 hours: Temp:  [98.4 F (36.9 C)-99 F (37.2 C)] 99 F (37.2 C) (10/02 0551) Pulse Rate:  [79-88] 83  (10/02 0551) Resp:  [18-20] 18  (10/02 0551) BP: (93-126)/(58-76) 93/58 mmHg (10/02 0551) SpO2:  [96 %-98 %] 96 % (10/02 0551)  Intake/Output from previous day: 10/01 0701 - 10/02 0700 In: 2391.6 [P.O.:1800; I.V.:591.6] Out: 3051 [Urine:3050; Stool:1] Intake/Output this shift:    General appearance: alert and cooperative Resp: clear to auscultation bilaterally and normal percussion bilaterally Cardio: regular rate and rhythm, S1, S2 normal, no murmur, click, rub or gallop GI: soft, non-tender; bowel sounds normal; no masses,  no organomegaly Extremities: extremities normal, atraumatic, no cyanosis or edema Skin - Np petechiae   LABS:  CBC    Component Value Date/Time   WBC 10.7* 03/27/2012 0620   RBC 4.47 03/27/2012 0620   HGB 12.4* 03/27/2012 0620   HCT  37.7* 03/27/2012 0620   PLT 315 03/27/2012 0620   MCV 84.3 03/27/2012 0620   MCH 27.7 03/27/2012 0620   MCHC 32.9 03/27/2012 0620   RDW 14.5 03/27/2012 0620   LYMPHSABS 2.0 03/25/2012 1441   MONOABS 1.0 03/25/2012 1441   EOSABS 0.5 03/25/2012 1441   BASOSABS 0.1 03/25/2012 1441     Basename 03/26/12 0255 03/25/12 1441  NA 137 137  K 4.2 3.9  CL 101 100  CO2 25 26  GLUCOSE 110* 79  BUN 16 17  CREATININE 1.18 1.18  CALCIUM 9.2 9.6   CEA - < 0.5   Sed Rate - 60 Antithrombin III - 84 Homocysteine - 12.9 ECHO - Unremarkable with adequate ejection fraction.  XRAYS/RESULTS:  Ct Abdomen Pelvis W Contrast  03/26/2012  *RADIOLOGY REPORT*  Clinical Data: Lung nodule and pulmonary embolus.  Evaluate for malignancy.  CT ABDOMEN AND PELVIS WITH CONTRAST  Technique:  Multidetector CT imaging of the abdomen and pelvis was performed following the standard protocol during bolus administration of intravenous contrast.  Contrast: OMNIPAQUE IOHEXOL 300 MG/ML  SOLN  Comparison: None.  Findings: The liver measures 20.7 cm in cranial caudal length. Liver parenchyma is heterogeneous.  The 7.3 cm area of increased attenuation is identified in the posterior right hepatic dome. This is probably related to some fatty sparing and appears to have unopacified hepatic veins coursing through it.  The spleen is enlarged, measuring 18.4 cm in cranial caudal  length. The stomach, duodenum, pancreas, gallbladder, adrenal glands, and kidneys are unremarkable.  No abdominal aortic aneurysm.  There is no lymphadenopathy in the abdomen.  No free fluid.  Imaging through the pelvis shows a distended bladder.  No pelvic sidewall lymphadenopathy.  No colonic diverticulitis.  Colon is unremarkable by CT imaging.  The terminal ileum is normal.  The appendix is normal.  Bone windows reveal no worrisome lytic or sclerotic osseous lesions.  IMPRESSION: Hepatosplenomegaly.  Liver parenchyma is heterogeneous and there is a 7 cm area of  increased attenuation in the posterior hepatic dome. This is most likely related to an area of fatty sparing.  MR imaging could be used to confirm, as clinically warranted.   Original Report Authenticated By: ERIC A. MANSELL, M.D.      ASSESSMENT and PLAN: 1. Extensive thrombosis - Continue heparin for at least 24 hours and if stable may switch to Xarelto or Lovenox (per patient's insurance).  Limit ambulation for another 24 hours. Hypercoaguable results still in process. 2. Heapto-splenomegaly noted on CT scan.  Will obtain MRI to further evaluate the liver and spleen.       Arlan Organ I., MD 03/27/2012

## 2012-03-27 NOTE — Progress Notes (Signed)
ANTICOAGULATION CONSULT NOTE - Follow Up Consult  Pharmacy Consult for heparin Indication: pulmonary embolus and DVT  Labs:  Basename 03/27/12 0620 03/26/12 2013 03/26/12 1044 03/26/12 1043 03/26/12 0255 03/25/12 2236 03/25/12 1511 03/25/12 1441  HGB 12.4* -- -- -- 12.3* -- -- --  HCT 37.7* -- -- -- 35.9* -- -- 39.0  PLT 315 -- -- -- 374 -- -- 373  APTT -- -- -- -- -- -- 46* --  LABPROT -- -- -- -- -- -- 14.8 --  INR -- -- -- -- -- -- 1.18 --  HEPARINUNFRC 0.20* 0.35 0.25* -- -- -- -- --  CREATININE -- -- -- -- 1.18 -- -- 1.18  CKTOTAL -- -- -- -- 54 -- -- --  CKMB -- -- -- -- -- -- -- --  TROPONINI -- -- -- <0.30 <0.30 <0.30 -- --    Assessment: 42yo male now subtherapeutic on heparin after one level at goal for PE/DVT.  Goal of Therapy:  Heparin level 0.3-0.7 units/ml   Plan:  Will increase heparin gtt by 2 units/kg/hr to 2700 units/hr and check level in 6hr.  Colleen Can PharmD BCPS 03/27/2012,7:30 AM

## 2012-03-27 NOTE — Progress Notes (Signed)
ANTICOAGULATION CONSULT NOTE - Follow Up Consult  Pharmacy Consult for Heparin Indication: B/L PE and B/L DVT  Labs:  Basename 03/27/12 1438 03/27/12 0620 03/26/12 2013 03/26/12 1043 03/26/12 0255 03/25/12 2236 03/25/12 1511 03/25/12 1441  HGB -- 12.4* -- -- 12.3* -- -- --  HCT -- 37.7* -- -- 35.9* -- -- 39.0  PLT -- 315 -- -- 374 -- -- 373  APTT -- -- -- -- -- -- 46* --  LABPROT -- -- -- -- -- -- 14.8 --  INR -- -- -- -- -- -- 1.18 --  HEPARINUNFRC 0.61 0.20* 0.35 -- -- -- -- --  CREATININE -- -- -- -- 1.18 -- -- 1.18  CKTOTAL -- -- -- -- 54 -- -- --  CKMB -- -- -- -- -- -- -- --  TROPONINI -- -- -- <0.30 <0.30 <0.30 -- --    Assessment: 41 y.o. M who continues on heparin for bilateral LE DVTs + bilateral PE this admission. Heparin level this afternoon is therapeutic (HL 0.6, goal of 0.3-0.7). Hgb/Hct slight drop, plts stable. No s/sx of bleeding noted this admission. Heparin infusing at the appropriate rate.   Noted plans for possible transition to either Xarelto or lovenox soon. Will f/u with hematology to confirm which of these agents they would like the patient to be discharged on. Per case management, the patient's copay for a 30 day supply of Xarelto would be $40 and with a voucher could be lowered to $10.   Goal of Therapy:  Heparin level 0.3-0.7 units/ml   Plan:  1. Continue heparin at current drip rate of 27,000 units/hr (27 ml/hr) 2. Will continue to monitor for any signs/symptoms of bleeding and will follow up with heparin level in 6 hours to confirm therapeutic 3. Will f/u plans for transition off drip soon.  Georgina Pillion, PharmD, BCPS Clinical Pharmacist Pager: [phone] 03/27/2012 4:13 PM

## 2012-03-27 NOTE — Progress Notes (Signed)
[facility] Progress Note [place] TEAM 1 - Stepdown/ICU TEAM   Andrew Clayton VWU:[id number] DOB: Oct 10, 1970 DOA: 03/25/2012 PCP: Londell Moh, MD   Assessment/Plan: Principal Problem:  *PE (pulmonary embolism) *Diagnosed with associated DVT and superficial thrombophlebitis *heme recommending heparin for 24 more hrs, then transition to xarelto vs lovenox  -follow up hypercoagulable labs  Active Problems:  DVT (deep venous thrombosis)/Superficial thrombophlebitis *continue anticoagulation as above -follow pending hypercoagulable labs Hepato-splenomegaly -per CT, MRI ordered per heme to further eval -appreciate Dr Lonell Face assistance Petechial rash *possibly related tocoagulopathic process   Leukocytosis *mild, Likely reactive, no source of infection   ADHD (attention deficit hyperactivity disorder) *Continue home meds    Code Status: Full Family Communication: wife at bedside Disposition Plan: To home when medically stable    Brief narrative: 41 year old male patient who was sent to the emergency department after being evaluated by his primary care physician for a four-day history of left lower extremity pain and swelling with associated lumps in the legs. He was found to have extensive  DVT as well as superficial thrombus in the legs and was sent to the hospital for admission. A CT of the chest was also completed that showed small bilateral pulmonary emboli. There was also incidental finding of a 5 mm right lower lobe pulmonary nodule. Because of this diagnosis patient was admitted to the step down unit for further monitoring and treatment  Consultants: Hematology  Procedures: None  Antibiotics: None  HPI/Subjective: Pt denies CP, no SOB. States decreased LLE pain  Objective: Blood pressure 93/58, pulse 83, temperature 99 F (37.2 C), temperature source Oral, resp. rate 18, height 6\' 1"  (1.854 m), weight 100.245 kg (221 lb), SpO2  96.00%.  Intake/Output Summary (Last 24 hours) at 03/27/12 1042 Last data filed at 03/27/12 0552  Gross per 24 hour  Intake 1705.88 ml  Output   2251 ml  Net -545.12 ml     Exam: General: No acute respiratory distress Lungs: Clear to auscultation bilaterally without wheezes or crackles Cardiovascular: Regular rate and rhythm without murmur gallop or rub normal S1 and S2 Abdomen: Nontender, nondistended, soft, bowel sounds positive, no rebound, no ascites, no appreciable mass Musculoskeletal: No significant cyanosis, clubbing of bilateral lower extremities Neurological: Alert and oriented x3, moves all extremities x4, no focal neurological deficits appreciated Skin: petechial rash Data Reviewed: Basic Metabolic Panel:  Lab 03/26/12 2956 03/25/12 1441  NA 137 137  K 4.2 3.9  CL 101 100  CO2 25 26  GLUCOSE 110* 79  BUN 16 17  CREATININE 1.18 1.18  CALCIUM 9.2 9.6  MG -- --  PHOS -- --   Liver Function Tests:  Lab 03/26/12 0255 03/25/12 1441  AST 23 20  ALT 16 17  ALKPHOS 67 70  BILITOT 0.6 0.7  PROT 7.0 7.6  ALBUMIN 3.3* 3.8   No results found for this basename: LIPASE:5,AMYLASE:5 in the last 168 hours No results found for this basename: AMMONIA:5 in the last 168 hours CBC:  Lab 03/27/12 0620 03/26/12 0255 03/25/12 1441  WBC 10.7* 10.7* 12.4*  NEUTROABS -- -- 8.9*  HGB 12.4* 12.3* 13.1  HCT 37.7* 35.9* 39.0  MCV 84.3 83.5 84.2  PLT 315 374 373   Cardiac Enzymes:  Lab 03/26/12 1043 03/26/12 0255 03/25/12 2236  CKTOTAL -- 54 --  CKMB -- -- --  CKMBINDEX -- -- --  TROPONINI <0.30 <0.30 <0.30   BNP (last 3 results) No results found for this basename: PROBNP:3 in the last 8760  hours CBG: No results found for this basename: GLUCAP:5 in the last 168 hours  Recent Results (from the past 240 hour(s))  MRSA PCR SCREENING     Status: Normal   Collection Time   03/26/12  4:58 AM      Component Value Range Status Comment   MRSA by PCR NEGATIVE  NEGATIVE Final       Studies:  Recent x-ray studies have been reviewed in detail.   Scheduled Meds:  I have Reviewed meds in detail    Donnalee Curry MD [facility] Office  [phone] Pager [phone]  On-Call/Text Page:      [email or web address]      password [email or web address]  If 7PM-7AM, please contact night-coverage [email or web address] Password [email or web address] 03/27/2012, 10:42 AM   LOS: 2 days

## 2012-03-27 NOTE — Progress Notes (Signed)
ANTICOAGULATION CONSULT NOTE - Follow Up Consult  Pharmacy Consult for Heparin Indication: B/L PE and B/L DVT  Labs:  Basename 03/27/12 2055 03/27/12 1438 03/27/12 0620 03/26/12 1043 03/26/12 0255 03/25/12 2236 03/25/12 1511 03/25/12 1441  HGB -- -- 12.4* -- 12.3* -- -- --  HCT -- -- 37.7* -- 35.9* -- -- 39.0  PLT -- -- 315 -- 374 -- -- 373  APTT -- -- -- -- -- -- 46* --  LABPROT -- -- -- -- -- -- 14.8 --  INR -- -- -- -- -- -- 1.18 --  HEPARINUNFRC 0.46 0.61 0.20* -- -- -- -- --  CREATININE -- -- -- -- 1.18 -- -- 1.18  CKTOTAL -- -- -- -- 54 -- -- --  CKMB -- -- -- -- -- -- -- --  TROPONINI -- -- -- <0.30 <0.30 <0.30 -- --    Assessment: 41 y.o. M who continues on heparin for bilateral LE DVTs + bilateral PE this admission. A repeat heparin level this pm  is therapeutic (HL 0.46).  Goal of Therapy:  Heparin level 0.3-0.7 units/ml   Plan:  - No heparin changes needed  Harland German, Ilda Basset D 03/27/2012 9:58 PM

## 2012-03-28 ENCOUNTER — Inpatient Hospital Stay (HOSPITAL_COMMUNITY): Payer: BC Managed Care – PPO

## 2012-03-28 ENCOUNTER — Telehealth: Payer: Self-pay | Admitting: Hematology and Oncology

## 2012-03-28 LAB — PROTEIN ELECTROPHORESIS, SERUM
Albumin ELP: 53.1 % — ABNORMAL LOW (ref 55.8–66.1)
Alpha-1-Globulin: 6.7 % — ABNORMAL HIGH (ref 2.9–4.9)
Alpha-2-Globulin: 13.9 % — ABNORMAL HIGH (ref 7.1–11.8)
Beta 2: 6.1 % (ref 3.2–6.5)
Beta Globulin: 5.5 % (ref 4.7–7.2)
Gamma Globulin: 14.7 % (ref 11.1–18.8)

## 2012-03-28 LAB — IMMUNOFIXATION ELECTROPHORESIS
IgA: 292 mg/dL (ref 68–379)
IgM, Serum: 99 mg/dL (ref 41–251)

## 2012-03-28 LAB — BASIC METABOLIC PANEL
BUN: 10 mg/dL (ref 6–23)
Creatinine, Ser: 1.29 mg/dL (ref 0.50–1.35)
GFR calc Af Amer: 79 mL/min — ABNORMAL LOW (ref >90)
GFR calc non Af Amer: 68 mL/min — ABNORMAL LOW (ref >90)
Potassium: 3.8 mEq/L (ref 3.5–5.1)

## 2012-03-28 LAB — CBC
HCT: 37.3 % — ABNORMAL LOW (ref 39.0–52.0)
MCHC: 32.7 g/dL (ref 30.0–36.0)
MCV: 85.6 fL (ref 78.0–100.0)
Platelets: 314 10*3/uL (ref 150–400)
RDW: 14.6 % (ref 11.5–15.5)
WBC: 9.8 10*3/uL (ref 4.0–10.5)

## 2012-03-28 LAB — HEPARIN LEVEL (UNFRACTIONATED)
Heparin Unfractionated: 0.26 IU/mL — ABNORMAL LOW (ref 0.30–0.70)
Heparin Unfractionated: 0.57 IU/mL (ref 0.30–0.70)

## 2012-03-28 MED ORDER — RIVAROXABAN 20 MG PO TABS: 20.0000 mg | ORAL_TABLET | Freq: Every day | ORAL | Status: DC

## 2012-03-28 MED ORDER — RIVAROXABAN 15 MG PO TABS
15.0000 mg | ORAL_TABLET | Freq: Two times a day (BID) | ORAL | Status: DC
  Administered 2012-03-29 – 2012-03-30 (×3): 15 mg via ORAL
  Filled 2012-03-28 (×5): qty 1

## 2012-03-28 MED ORDER — GADOBENATE DIMEGLUMINE 529 MG/ML IV SOLN
20.0000 mL | Freq: Once | INTRAVENOUS | Status: AC
  Administered 2012-03-28: 20 mL via INTRAVENOUS

## 2012-03-28 NOTE — Telephone Encounter (Signed)
LVM TO PT'S WIFE CELL PHONE ON HOSP. F/U 04/02/12 @ 2:00 MAILED NP PACKET

## 2012-03-28 NOTE — Progress Notes (Signed)
[facility] Progress Note [place] TEAM 1 - Stepdown/ICU TEAM   Blong Busk ZOX:[id number] DOB: 12-11-1970 DOA: 03/25/2012 PCP: Londell Moh, MD   Assessment/Plan: Principal Problem:  *PE (pulmonary embolism) *Diagnosed with associated DVT and superficial thrombophlebitis *heme recommending heparin today, then transition to xarelto in a.m. -hypercoagulable labs with positive  Anticoagulant, will likely need anticoagulation long-term (unless subsequentlly determined to be a false positive)-heme for further recommendations  Active Problems:  DVT (deep venous thrombosis)/Superficial thrombophlebitis *continue anticoagulation as above -follow pending hypercoagulable labs Hepato-splenomegaly -per CT, await MRI results to further eval -appreciate Dr Lonell Face assistance Petechial rash *resolving, follow   Leukocytosis *mild, Likely reactive, no source of infection   ADHD (attention deficit hyperactivity disorder) *Continue home meds    Code Status: Full Family Communication: wife at bedside Disposition Plan: To home when medically stable    Brief narrative: 41 year old male patient who was sent to the emergency department after being evaluated by his primary care physician for a four-day history of left lower extremity pain and swelling with associated lumps in the legs. He was found to have extensive  DVT as well as superficial thrombus in the legs and was sent to the hospital for admission. A CT of the chest was also completed that showed small bilateral pulmonary emboli. There was also incidental finding of a 5 mm right lower lobe pulmonary nodule. Because of this diagnosis patient was admitted to the step down unit for further monitoring and treatment  Consultants: Hematology  Procedures: None  Antibiotics: None  HPI/Subjective: Pt denies CP, no SOB.  decreased LLE pain  Objective: Blood pressure 114/77, pulse 81, temperature 98.3 F (36.8 C),  temperature source Oral, resp. rate 18, height 6\' 1"  (1.854 m), weight 100.245 kg (221 lb), SpO2 96.00%.  Intake/Output Summary (Last 24 hours) at 03/28/12 1101 Last data filed at 03/28/12 0620  Gross per 24 hour  Intake    970 ml  Output   1600 ml  Net   -630 ml     Exam: General: No acute respiratory distress Lungs: Clear to auscultation bilaterally without wheezes or crackles Cardiovascular: Regular rate and rhythm without murmur gallop or rub normal S1 and S2 Abdomen: Nontender, nondistended, soft, bowel sounds positive, no rebound, no ascites, no appreciable mass Musculoskeletal: No significant cyanosis, clubbing of bilateral lower extremities Neurological: Alert and oriented x3, moves all extremities x4, no focal neurological deficits appreciated Skin: Much decreased petechial rash Data Reviewed: Basic Metabolic Panel:  Lab 03/28/12 8119 03/26/12 0255 03/25/12 1441  NA 139 137 137  K 3.8 4.2 3.9  CL 105 101 100  CO2 24 25 26   GLUCOSE 107* 110* 79  BUN 10 16 17   CREATININE 1.29 1.18 1.18  CALCIUM 9.0 9.2 9.6  MG -- -- --  PHOS -- -- --   Liver Function Tests:  Lab 03/26/12 0255 03/25/12 1441  AST 23 20  ALT 16 17  ALKPHOS 67 70  BILITOT 0.6 0.7  PROT 7.0 7.6  ALBUMIN 3.3* 3.8   No results found for this basename: LIPASE:5,AMYLASE:5 in the last 168 hours No results found for this basename: AMMONIA:5 in the last 168 hours CBC:  Lab 03/28/12 0640 03/27/12 0620 03/26/12 0255 03/25/12 1441  WBC 9.8 10.7* 10.7* 12.4*  NEUTROABS -- -- -- 8.9*  HGB 12.2* 12.4* 12.3* 13.1  HCT 37.3* 37.7* 35.9* 39.0  MCV 85.6 84.3 83.5 84.2  PLT 314 315 374 373   Cardiac Enzymes:  Lab 03/26/12 1043 03/26/12 0255 03/25/12  2236  CKTOTAL -- 54 --  CKMB -- -- --  CKMBINDEX -- -- --  TROPONINI <0.30 <0.30 <0.30   BNP (last 3 results) No results found for this basename: PROBNP:3 in the last 8760 hours CBG: No results found for this basename: GLUCAP:5 in the last 168  hours  Recent Results (from the past 240 hour(s))  MRSA PCR SCREENING     Status: Normal   Collection Time   03/26/12  4:58 AM      Component Value Range Status Comment   MRSA by PCR NEGATIVE  NEGATIVE Final      Studies:  Recent x-ray studies have been reviewed in detail.   Scheduled Meds:  I have Reviewed meds in detail    Donnalee Curry MD [facility] Office  [phone] Pager [phone]  On-Call/Text Page:      [email or web address]      password [email or web address]  If 7PM-7AM, please contact night-coverage [email or web address] Password [email or web address] 03/28/2012, 11:01 AM   LOS: 3 days

## 2012-03-28 NOTE — Progress Notes (Addendum)
ANTICOAGULATION CONSULT NOTE - Follow Up Consult  Pharmacy Consult for Heparin (to transition to Xarelto on 10/4 a.m -- see addendum below) Indication: B/L PE and B/L DVT  Patient Measurements:  Height: 6\' 1"  (185.4 cm)  Weight: 222 lb (100.699 kg)  IBW/kg (Calculated) : 79.9  Heparin Dosing Weight:100 kg  Labs:  Basename 03/28/12 0640 03/27/12 2055 03/27/12 1438 03/27/12 0620 03/26/12 1043 03/26/12 0255 03/25/12 2236 03/25/12 1511 03/25/12 1441  HGB 12.2* -- -- 12.4* -- -- -- -- --  HCT 37.3* -- -- 37.7* -- 35.9* -- -- --  PLT 314 -- -- 315 -- 374 -- -- --  APTT -- -- -- -- -- -- -- 46* --  LABPROT -- -- -- -- -- -- -- 14.8 --  INR -- -- -- -- -- -- -- 1.18 --  HEPARINUNFRC 0.26* 0.46 0.61 -- -- -- -- -- --  CREATININE 1.29 -- -- -- -- 1.18 -- -- 1.18  CKTOTAL -- -- -- -- -- 54 -- -- --  CKMB -- -- -- -- -- -- -- -- --  TROPONINI -- -- -- -- <0.30 <0.30 <0.30 -- --    Assessment: 41 y.o. M who continues on heparin for bilateral LE DVTs + bilateral PE this admission. Heparin level this morning is slightly SUBtherapeutic (HL 0.26, goal of 0.3-0.7). Hgb/Hct/Plts stable. No s/sx of bleeding noted this admission. Heparin infusing at the appropriate rate.   Noted plans for possible transition to either Xarelto or lovenox soon. Will f/u with hematology to confirm which of these agents they would like the patient to be discharged on. Per case management, the patient's copay for a 30 day supply of Xarelto would be $40 and with a voucher could be lowered to $10.   Goal of Therapy:  Heparin level 0.3-0.7 units/ml   Plan:  1. Increase heparin drip rate to 29,000 units/hr (29 ml/hr) 2. Will continue to monitor for any signs/symptoms of bleeding and will follow up with heparin level in 6 hours to confirm therapeutic 3. Will f/u with hematology today for plans to transition off of the heparin drip  Georgina Pillion, PharmD, BCPS Clinical Pharmacist Pager: [phone] 03/28/2012 8:49 AM     --------------------------------------------------------------------- Addendum:  Received orders to transition this patient to Xarelto tomorrow a.m per request of Dr. Dalene Carrow. Appropriate transition between these two agents is to give Xarelto at the time the heparin drip is discontinued. Will plan to start this transition on 10/4 a.m with breakfast. The patient and his wife have already been educated this admission -- all questions have been addressed.  Plan: 1. Discontinue heparin drip at 0800 on 10/4 2. Start Xarelto 15 mg twice a day with breakfast and supper on 03/29/12 to continue through 04/18/12 3. Start Xarelto 20 mg once a day on 04/19/12 to be continued for a minimum of 6 months or until the hematologist deems necessary 4. Will continue to monitor patient was any s/sx of bleeding and/or dose adjustments required for renal function.  Georgina Pillion, PharmD, BCPS Clinical Pharmacist Pager: [phone] 03/28/2012 1:04 PM

## 2012-03-28 NOTE — Telephone Encounter (Signed)
C/D 03/28/12 for appt 04/02/12

## 2012-03-28 NOTE — Progress Notes (Signed)
Pharmacy Consult - Heparin  PM heparin level = 0.57 (goal = 0.3 to 0.7) Planning switch to Xarelto in AM  Plan: 1) No change in heparin rate - continue at 2900 units / hr 2) Follow up AM  Thank you. Okey Regal, PharmD [phone]

## 2012-03-28 NOTE — Progress Notes (Signed)
SUBJECTIVE:  Doing better. No bleeding.  No further episodes of phlebitis.   MEDICATIONS:  Scheduled:   . amphetamine-dextroamphetamine  50 mg Oral Daily  . clonazePAM  0.5 mg Oral Daily  . gadobenate dimeglumine  20 mL Intravenous Once  . polyethylene glycol  17 g Oral Daily  . sodium chloride  3 mL Intravenous Q12H   Continuous:   . sodium chloride 1,000 mL (03/28/12 1213)  . heparin 2,900 Units/hr (03/28/12 0926)   RUE:[id number], acetaminophen, ondansetron (ZOFRAN) IV, ondansetron   PHYSICAL EXAM  Vital signs in last 24 hours: Temp:  [98.3 F (36.8 C)-98.9 F (37.2 C)] 98.3 F (36.8 C) (10/03 0740) Pulse Rate:  [81-91] 81  (10/03 0740) Resp:  [18-20] 18  (10/03 0740) BP: (102-114)/(73-77) 114/77 mmHg (10/03 0740) SpO2:  [94 %-96 %] 96 % (10/03 0740)  Intake/Output from previous day: 10/02 0701 - 10/03 0700 In: 1330 [P.O.:480; I.V.:850] Out: 2000 [Urine:2000] Intake/Output this shift:    General appearance: alert and cooperative Resp: clear to auscultation bilaterally and normal percussion bilaterally Cardio: regular rate and rhythm, S1, S2 normal, no murmur, click, rub or gallop GI: soft, non-tender; bowel sounds normal; no masses,  no organomegaly Extremities: extremities normal, atraumatic, no cyanosis or edema   LABS:  CBC    Component Value Date/Time   WBC 9.8 03/28/2012 0640   RBC 4.36 03/28/2012 0640   HGB 12.2* 03/28/2012 0640   HCT 37.3* 03/28/2012 0640   PLT 314 03/28/2012 0640   MCV 85.6 03/28/2012 0640   MCH 28.0 03/28/2012 0640   MCHC 32.7 03/28/2012 0640   RDW 14.6 03/28/2012 0640   LYMPHSABS 2.0 03/25/2012 1441   MONOABS 1.0 03/25/2012 1441   EOSABS 0.5 03/25/2012 1441   BASOSABS 0.1 03/25/2012 1441     Basename 03/28/12 0640 03/26/12 0255  NA 139 137  K 3.8 4.2  CL 105 101  CO2 24 25  GLUCOSE 107* 110*  BUN 10 16  CREATININE 1.29 1.18  CALCIUM 9.0 9.2     ASSESSMENT and PLAN: 1.  Extensive thrombosis - Continue gtt heparin. On  03/29/12 transition to Xarelto. Ok to begin ambulating. 2. Hepatosplenomegaly - MRI pending  Pt has follow up with hematology on 04/02/12   Angeles Zehner I., MD 03/28/2012

## 2012-03-29 LAB — CBC
MCH: 27.9 pg (ref 26.0–34.0)
MCV: 84.6 fL (ref 78.0–100.0)
Platelets: 312 10*3/uL (ref 150–400)
RDW: 14.9 % (ref 11.5–15.5)

## 2012-03-29 NOTE — Progress Notes (Signed)
ANTICOAGULATION CONSULT NOTE - Follow Up Consult  Pharmacy Consult for Xarelto (s/p transition from Heparin drip on 10/4) Indication: B/L PE and B/L DVT  Patient Measurements:  Height: 6\' 1"  (185.4 cm)  Weight: 222 lb (100.699 kg)  IBW/kg (Calculated) : 79.9  Heparin Dosing Weight:100 kg  Labs:  Basename 03/29/12 0655 03/28/12 1526 03/28/12 0640 03/27/12 0620 03/26/12 1043  HGB 12.5* -- 12.2* -- --  HCT 37.9* -- 37.3* 37.7* --  PLT 312 -- 314 315 --  APTT -- -- -- -- --  LABPROT -- -- -- -- --  INR -- -- -- -- --  HEPARINUNFRC 0.63 0.57 0.26* -- --  CREATININE -- -- 1.29 -- --  CKTOTAL -- -- -- -- --  CKMB -- -- -- -- --  TROPONINI -- -- -- -- <0.30    Assessment: 41 y.o. M who transitioned appropriately from heparin to Xarelto this morning for continued treatment of bilateral LE DVTs + bilateral PE this admission. Hgb/Hct/Plts stable. No s/sx of bleeding noted this admission. The patient and his wife have been educated on Xarelto.   Goal of Therapy:  Heparin level 0.3-0.7 units/ml   Plan:  1. Continue Xarelto 15 mg twice a day with breakfast and supper -- to continue through 04/18/12  2. Start Xarelto 20 mg once a day on 04/19/12 to be continued for a minimum of 6 months or until the hematologist deems necessary  3. Will continue to monitor patient was any s/sx of bleeding and/or dose adjustments required for renal function.  Georgina Pillion, PharmD, BCPS Clinical Pharmacist Pager: [phone] 03/29/2012 9:58 AM

## 2012-03-29 NOTE — Progress Notes (Signed)
Noted plan to d/c pt within next 24hr. Met with pt and wife and [facility] card for pt with insurance given, and possiblity of decreasing copay discussed. They understand need to active the card for possible $10 copays per month.  Johny Shock RN MPH Case Manager [phone]

## 2012-03-29 NOTE — Progress Notes (Signed)
[facility] Progress Note [place] TEAM 1 - Stepdown/ICU TEAM   [place] Andrew Clayton ZOX:[id number] DOB: March 25, 1971 DOA: 03/25/2012 PCP: Londell Moh, MD   Assessment/Plan: Principal Problem:  *PE (pulmonary embolism) *Diagnosed with associated DVT and superficial thrombophlebitis *Discussed with Dr. Dalene Carrow, patient started on Xarelto today and she recommends monitoring overnight prior to DC in the a.m. -hypercoagulable labs with positive  Anticoagulant, will likely need anticoagulation long-term (unless subsequentlly determined to be a false positive)-heme for further recommendations  Active Problems:  DVT (deep venous thrombosis)/Superficial thrombophlebitis *continue anticoagulation as above -follow pending hypercoagulable labs splenomegaly -per CT, MRI with IVC clot and mild splenomegaly, no hepatomegaly reported on MRI. results to further eval -Splenomegaly likely secondary to clot burden -appreciate Dr Lonell Face assistance Petechial rash *resolving, follow   Leukocytosis *mild, Likely reactive, no source of infection   ADHD (attention deficit hyperactivity disorder) *Continue home meds    Code Status: Full Family Communication: wife at bedside Disposition Plan: To home in a.m.    Brief narrative: 41 year old male patient who was sent to the emergency department after being evaluated by his primary care physician for a four-day history of left lower extremity pain and swelling with associated lumps in the legs. He was found to have extensive  DVT as well as superficial thrombus in the legs and was sent to the hospital for admission. A CT of the chest was also completed that showed small bilateral pulmonary emboli. There was also incidental finding of a 5 mm right lower lobe pulmonary nodule. Because of this diagnosis patient was admitted to the step down unit for further monitoring and  treatment  Consultants: Hematology  Procedures: None  Antibiotics: None  HPI/Subjective: Pt denies CP, no SOB.  Wife at bedside  Objective: Blood pressure 121/73, pulse 74, temperature 98.6 F (37 C), temperature source Oral, resp. rate 18, height 6\' 1"  (1.854 m), weight 100.426 kg (221 lb 6.4 oz), SpO2 98.00%.  Intake/Output Summary (Last 24 hours) at 03/29/12 1239 Last data filed at 03/29/12 0900  Gross per 24 hour  Intake 3707.53 ml  Output    901 ml  Net 2806.53 ml     Exam: General: No acute respiratory distress Lungs: Clear to auscultation bilaterally without wheezes or crackles Cardiovascular: Regular rate and rhythm without murmur gallop or rub normal S1 and S2 Abdomen: Nontender, nondistended, soft, bowel sounds positive, no rebound, no ascites, no appreciable mass Musculoskeletal: No significant cyanosis, clubbing of bilateral lower extremities Neurological: Alert and oriented x3, moves all extremities x4, no focal neurological deficits appreciated Skin: Much decreased petechial rash Data Reviewed: Basic Metabolic Panel:  Lab 03/28/12 8119 03/26/12 0255 03/25/12 1441  NA 139 137 137  K 3.8 4.2 3.9  CL 105 101 100  CO2 24 25 26   GLUCOSE 107* 110* 79  BUN 10 16 17   CREATININE 1.29 1.18 1.18  CALCIUM 9.0 9.2 9.6  MG -- -- --  PHOS -- -- --   Liver Function Tests:  Lab 03/26/12 0255 03/25/12 1441  AST 23 20  ALT 16 17  ALKPHOS 67 70  BILITOT 0.6 0.7  PROT 7.0 7.6  ALBUMIN 3.3* 3.8   No results found for this basename: LIPASE:5,AMYLASE:5 in the last 168 hours No results found for this basename: AMMONIA:5 in the last 168 hours CBC:  Lab 03/29/12 0655 03/28/12 0640 03/27/12 0620 03/26/12 0255 03/25/12 1441  WBC 10.7* 9.8 10.7* 10.7* 12.4*  NEUTROABS -- -- -- -- 8.9*  HGB 12.5* 12.2* 12.4* 12.3* 13.1  HCT 37.9* 37.3* 37.7* 35.9* 39.0  MCV 84.6 85.6 84.3 83.5 84.2  PLT 312 314 315 374 373   Cardiac Enzymes:  Lab 03/26/12 1043 03/26/12 0255  03/25/12 2236  CKTOTAL -- 54 --  CKMB -- -- --  CKMBINDEX -- -- --  TROPONINI <0.30 <0.30 <0.30   BNP (last 3 results) No results found for this basename: PROBNP:3 in the last 8760 hours CBG: No results found for this basename: GLUCAP:5 in the last 168 hours  Recent Results (from the past 240 hour(s))  MRSA PCR SCREENING     Status: Normal   Collection Time   03/26/12  4:58 AM      Component Value Range Status Comment   MRSA by PCR NEGATIVE  NEGATIVE Final      Studies:  Recent x-ray studies have been reviewed in detail.   Scheduled Meds:  I have Reviewed meds in detail    Donnalee Curry MD [facility] Office  [phone] Pager [phone]  On-Call/Text Page:      [email or web address]      password [email or web address]  If 7PM-7AM, please contact night-coverage [email or web address] Password [email or web address] 03/29/2012, 12:39 PM   LOS: 4 days

## 2012-03-30 DIAGNOSIS — D72829 Elevated white blood cell count, unspecified: Secondary | ICD-10-CM

## 2012-03-30 LAB — CBC
MCHC: 33.2 g/dL (ref 30.0–36.0)
Platelets: 305 10*3/uL (ref 150–400)
RDW: 15 % (ref 11.5–15.5)
WBC: 10.7 10*3/uL — ABNORMAL HIGH (ref 4.0–10.5)

## 2012-03-30 MED ORDER — RIVAROXABAN 15 MG PO TABS: 15.0000 mg | ORAL_TABLET | Freq: Two times a day (BID) | ORAL | Status: DC

## 2012-03-30 MED ORDER — RIVAROXABAN 20 MG PO TABS: 20.0000 mg | ORAL_TABLET | Freq: Every day | ORAL | Status: DC

## 2012-03-30 NOTE — Discharge Summary (Signed)
Physician Discharge Summary  Andrew Clayton ZOX:[id number] DOB: 25-Oct-1970 DOA: 03/25/2012  PCP: Londell Moh, MD  Admit date: 03/25/2012 Discharge date: 03/30/2012  Recommendations for Outpatient Follow-up:      Follow-up Information    Follow up with ODOGWU,LAURETTA I., MD. On 04/02/2012.   Contact information:   [address] [place] [address] [phone]       Follow up with Londell Moh, MD. (in 1-2weeks, call for appt upon discharge)    Contact information:   [address] [place] [address] [place] [address] [phone]           Discharge Diagnoses:  Principal Problem:  *PE (pulmonary embolism) Active Problems:  DVT (deep venous thrombosis)  Superficial thrombophlebitis  Maculopapular rash, localized - extremities  ADHD (attention deficit hyperactivity disorder)  Leukocytosis   Discharge Condition: Improved/stable  Diet recommendation: regular  Filed Weights   03/28/12 2202 03/29/12 2113  Weight: 100.426 kg (221 lb 6.4 oz) 100.426 kg (221 lb 6.4 oz)    History of present illness: Improved/stable  Patient is a 41 year old male with history of ADHD was referred to the ER after patient's PCP had found patient had DVT. Patient has been having some pain and swelling like knots in his left leg over the last few days. Since it worsened patient had gone to his PCP and Doppler was done which showed DVT. In the ER patient had repeat Dopplers done bilaterally which showed extensive DVT of both lower extremity. ER physician Dr. Ignacia Palma had contacted pulmonary critical care Dr. Tyson Alias who advised patient to have CT angiogram done which shows bilateral pulmonary embolism. Patient at this time is hemodynamically stable. Denies any chest pain or shortness of breath. 2 weeks ago he had noticed some lumps in his left upper extremity. And over the last one year he has been explaining off and on dizziness but denies any loss of consciousness.  Per ER  physician Dr. Tyson Alias requested patient to be observed in step down. Patient has been started IV heparin and admitted for further evaluation and management.  Hospital Course by problem list:  *PE (pulmonary embolism)  *Diagnosed with associated DVT and superficial thrombophlebitis  *Upon admission patient was started on IV heparin and observed in the step down unit. *Heme/Oncology was consulted and Dr. Dalene Carrow saw the patient, agreed with heparin followed and recommended switching to  Xarelto and monitoring overnight which was done and the patient has had no bleeding complications. *hypercoagulable labs on admission revealed positive lupus Anticoagulant, will likely need anticoagulation long-term (unless subsequentlly determined to be a false positive)-she is to followup with Dr. Dalene Carrow for further management as appropriate. *He improved clinically and has remained hemodynamically stable. He discharged at this time for outpatient followup as above. Active Problems:  DVT (deep venous thrombosis)/Superficial thrombophlebitis  *continue anticoagulation as above  *follow up outpt as above splenomegaly  *CT scan on admission showed the hepatosplenomegaly and hematology was consulted. Dr.Odogwu  saw the patient and an MRI was ordered and showed IVC clot and mild splenomegaly, no hepatomegaly reported on MRI. *The impression was that the Splenomegaly likely secondary to clot burden  *His to followup with Dr.Odogwu outpatient Petechial rash  *resolving, follow  Leukocytosis  *mild, Likely reactive, no source of infection. He remained afebrile and hemodynamically stable in the hospital.  ADHD (attention deficit hyperactivity disorder)  *Continue home meds   Procedures:  none  Consultations:  Heme- Dr Dalene Carrow  Discharge Exam: Filed Vitals:   03/29/12 1400 03/29/12 1800 03/29/12 2113  03/30/12 0556  BP: 116/70 110/72 123/82 115/70  Pulse: 76 80 89 81  Temp: 98 F (36.7 C) 98.4 F (36.9 C)  98.5 F (36.9 C) 98.5 F (36.9 C)  TempSrc: Oral Oral Oral Oral  Resp: 18 18 18 18   Height:   6\' 1"  (1.854 m)   Weight:   100.426 kg (221 lb 6.4 oz)   SpO2: 98% 98% 96% 94%   Exam:  General: No acute respiratory distress  Lungs: Clear to auscultation bilaterally without wheezes or crackles  Cardiovascular: Regular rate and rhythm without murmur gallop or rub normal S1 and S2  Abdomen: Nontender, nondistended, soft, bowel sounds positive, no rebound, no ascites, no appreciable mass  Musculoskeletal: No significant cyanosis, clubbing of bilateral lower extremities  Neurological: Alert and oriented x3, moves all extremities x4, no focal neurological deficits appreciated  Skin: no  rash    Discharge Instructions  Discharge Orders    Future Appointments: Provider: Department: Dept Phone: Center:   04/02/2012 2:00 PM [facility] [phone] None   04/02/2012 2:15 PM Radene Gunning [facility] [phone] None   04/02/2012 2:30 PM Laurice Record, MD [facility] [phone] None     Future Orders Please Complete By Expires   Diet general      Increase activity slowly          Medication List     As of 03/30/2012 10:11 AM    STOP taking these medications         ibuprofen 800 MG tablet   Commonly known as: ADVIL,MOTRIN      TAKE these medications         amphetamine-dextroamphetamine 25 MG 24 hr capsule   Commonly known as: ADDERALL XR   Take 50 mg by mouth daily.      clonazePAM 0.5 MG tablet   Commonly known as: KLONOPIN   Take 0.5 mg by mouth daily.      Rivaroxaban 15 MG Tabs tablet   Commonly known as: XARELTO   Take 1 tablet (15 mg total) by mouth 2 (two) times daily with a meal.      Rivaroxaban 20 MG Tabs   Commonly known as: XARELTO   Take 1 tablet (20 mg total) by mouth daily with breakfast.           Follow-up Information    Follow up with Arlan Organ I., MD. On 04/02/2012.   Contact  information:   [address] [place] [address] [phone]       Follow up with Londell Moh, MD. (in 1-2weeks, call for appt upon discharge)    Contact information:   [address] [place] [facility] [place] [address] [phone]           The results of significant diagnostics from this hospitalization (including imaging, microbiology, ancillary and laboratory) are listed below for reference.    Significant Diagnostic Studies: Ct Angio Chest Pe W/cm &/or Wo Cm  03/25/2012  *RADIOLOGY REPORT*  Clinical Data: Known DVT with chest pain shortness of breath.  CT ANGIOGRAPHY CHEST  Technique:  Multidetector CT imaging of the chest using the standard protocol during bolus administration of intravenous contrast. Multiplanar reconstructed images including MIPs were obtained and reviewed to evaluate the vascular anatomy.  Contrast: OMNIPAQUE IOHEXOL 350 MG/ML SOLN  Comparison: None.  Findings: The study is limited by patient breathing motion throughout image acquisition.  There is no large central pulmonary embolus in the main pulmonary arteries are  low or pulmonary arteries.  However tiny apparent filling defects are seen in subsegmental pulmonary arteries to both lower lobes (see posteromedial right lower lobe on image 197 and posterior left lower lobe on image 180, both of series 8).  No thoracic aortic aneurysm.  No dissection of the thoracic aorta. Heart size is normal.  There is no pericardial or pleural effusion.  No axillary R bowel, normal in mediastinal, or hilar lymphadenopathy.  The lung windows show some dependent atelectasis in the lower lobes.  There is a 5 mm pulmonary nodule in the right lower lobe on image 49 of series 7.  Bone windows reveal no worrisome lytic or sclerotic osseous lesions.  IMPRESSION: Tiny filling defects identified and subsegmental pulmonary arteries to both lower lobes, consist with acute pulmonary embolus.  5 mm right lower lobe pulmonary  nodule. If the patient is at high risk for bronchogenic carcinoma, follow-up chest CT at 6-12 months is recommended.  If the patient is at low risk for bronchogenic carcinoma, follow-up chest CT at 12 months is recommended.  This recommendation follows the consensus statement: Guidelines for Management of Small Pulmonary Nodules Detected on CT Scans: A Statement from the Fleischner Society as published in Radiology 2005; 237:395-400.  Critical Value/emergent results were called by telephone at the time of interpretation on 03/25/2012 at 1930 hours by me to Dr. Ignacia Palma, who verbally acknowledged these results.  .   Original Report Authenticated By: ERIC A. MANSELL, M.D.    Mr Abdomen W Wo Contrast  03/28/2012  *RADIOLOGY REPORT*  Clinical Data: Indeterminate lesion within the liver on comparison CT.  MRI ABDOMEN WITH AND WITHOUT CONTRAST  Technique:  Multiplanar multisequence MR imaging of the abdomen was performed both before and after administration of intravenous contrast.  Contrast: 20mL MULTIHANCE GADOBENATE DIMEGLUMINE 529 MG/ML IV SOLN  Comparison: CT abdomen 03/26/2012  Findings:  Liver: There is no focal lesion within the liver on the noncontrast pulse sequences.  On the early arterial sequences, there is intense geographic enhancement the dome the liver measuring the 7.5 x 4.8 cm (image 25, series 1201).  On the more delayed vascular sequences, this region does not enhance appreciably.  Legrand Rams this to represent a perfusion abnormalities rather than a true parenchymal lesion.  There is no evidence of biliary duct dilatation.  The portal veins are patent.  The splenic vein is patent.  The spleen is mildly enlarged with a  calculated volume of 650 ml.  No evidence of ascites in the abdomen.  The gallbladder, pancreas, and adrenal glands are normal.  The kidneys are normal.  Stomach and limited view of the bowel is unremarkable.  The aorta is normal.  There is a  small tubular filling defect within the  suprarenal IVC all of the postcontrast series (series 05/2000, image 71 for example).  This is a short tubular filling defects which terminates below the liver.  IMPRESSION:  1.  Hyperenhancing geographic region within the dome of the right hepatic lobe is felt to represent a perfusion abnormality  rather than a true parenchymal lesion. 2.  Splenomegaly. 3.  Tubular filling defect within the inferior vena cava is likely a thromboembolism. 4.  Patent portal veins and splenic vein.   Original Report Authenticated By: Genevive Bi, M.D.    Ct Abdomen Pelvis W Contrast  03/26/2012  *RADIOLOGY REPORT*  Clinical Data: Lung nodule and pulmonary embolus.  Evaluate for malignancy.  CT ABDOMEN AND PELVIS WITH CONTRAST  Technique:  Multidetector CT imaging of  the abdomen and pelvis was performed following the standard protocol during bolus administration of intravenous contrast.  Contrast: OMNIPAQUE IOHEXOL 300 MG/ML  SOLN  Comparison: None.  Findings: The liver measures 20.7 cm in cranial caudal length. Liver parenchyma is heterogeneous.  The 7.3 cm area of increased attenuation is identified in the posterior right hepatic dome. This is probably related to some fatty sparing and appears to have unopacified hepatic veins coursing through it.  The spleen is enlarged, measuring 18.4 cm in cranial caudal length. The stomach, duodenum, pancreas, gallbladder, adrenal glands, and kidneys are unremarkable.  No abdominal aortic aneurysm.  There is no lymphadenopathy in the abdomen.  No free fluid.  Imaging through the pelvis shows a distended bladder.  No pelvic sidewall lymphadenopathy.  No colonic diverticulitis.  Colon is unremarkable by CT imaging.  The terminal ileum is normal.  The appendix is normal.  Bone windows reveal no worrisome lytic or sclerotic osseous lesions.  IMPRESSION: Hepatosplenomegaly.  Liver parenchyma is heterogeneous and there is a 7 cm area of increased attenuation in the posterior hepatic dome.  This is most likely related to an area of fatty sparing.  MR imaging could be used to confirm, as clinically warranted.   Original Report Authenticated By: ERIC A. MANSELL, M.D.     Microbiology: Recent Results (from the past 240 hour(s))  MRSA PCR SCREENING     Status: Normal   Collection Time   03/26/12  4:58 AM      Component Value Range Status Comment   MRSA by PCR NEGATIVE  NEGATIVE Final      Labs: Basic Metabolic Panel:  Lab 03/28/12 1610 03/26/12 0255 03/25/12 1441  NA 139 137 137  K 3.8 4.2 3.9  CL 105 101 100  CO2 24 25 26   GLUCOSE 107* 110* 79  BUN 10 16 17   CREATININE 1.29 1.18 1.18  CALCIUM 9.0 9.2 9.6  MG -- -- --  PHOS -- -- --   Liver Function Tests:  Lab 03/26/12 0255 03/25/12 1441  AST 23 20  ALT 16 17  ALKPHOS 67 70  BILITOT 0.6 0.7  PROT 7.0 7.6  ALBUMIN 3.3* 3.8   No results found for this basename: LIPASE:5,AMYLASE:5 in the last 168 hours No results found for this basename: AMMONIA:5 in the last 168 hours CBC:  Lab 03/30/12 0500 03/29/12 0655 03/28/12 0640 03/27/12 0620 03/26/12 0255 03/25/12 1441  WBC 10.7* 10.7* 9.8 10.7* 10.7* --  NEUTROABS -- -- -- -- -- 8.9*  HGB 12.3* 12.5* 12.2* 12.4* 12.3* --  HCT 37.1* 37.9* 37.3* 37.7* 35.9* --  MCV 85.3 84.6 85.6 84.3 83.5 --  PLT 305 312 314 315 374 --   Cardiac Enzymes:  Lab 03/26/12 1043 03/26/12 0255 03/25/12 2236  CKTOTAL -- 54 --  CKMB -- -- --  CKMBINDEX -- -- --  TROPONINI <0.30 <0.30 <0.30   BNP: BNP (last 3 results) No results found for this basename: PROBNP:3 in the last 8760 hours CBG: No results found for this basename: GLUCAP:5 in the last 168 hours  Time coordinating discharge: >30 minutes  Signed:  Iyani Dresner C  [facility] 03/30/2012, 10:11 AM

## 2012-04-01 ENCOUNTER — Other Ambulatory Visit: Payer: Self-pay | Admitting: *Deleted

## 2012-04-01 DIAGNOSIS — I2699 Other pulmonary embolism without acute cor pulmonale: Secondary | ICD-10-CM

## 2012-04-01 DIAGNOSIS — I82409 Acute embolism and thrombosis of unspecified deep veins of unspecified lower extremity: Secondary | ICD-10-CM

## 2012-04-02 ENCOUNTER — Ambulatory Visit (HOSPITAL_BASED_OUTPATIENT_CLINIC_OR_DEPARTMENT_OTHER): Payer: BC Managed Care – PPO | Admitting: Hematology and Oncology

## 2012-04-02 ENCOUNTER — Encounter: Payer: Self-pay | Admitting: Hematology and Oncology

## 2012-04-02 ENCOUNTER — Other Ambulatory Visit: Payer: Self-pay | Admitting: Hematology and Oncology

## 2012-04-02 ENCOUNTER — Telehealth: Payer: Self-pay | Admitting: Hematology and Oncology

## 2012-04-02 ENCOUNTER — Other Ambulatory Visit (HOSPITAL_BASED_OUTPATIENT_CLINIC_OR_DEPARTMENT_OTHER): Payer: BC Managed Care – PPO | Admitting: Lab

## 2012-04-02 ENCOUNTER — Ambulatory Visit: Payer: BC Managed Care – PPO

## 2012-04-02 VITALS — BP 121/78 | HR 94 | Temp 97.2°F | Resp 18 | Ht 73.0 in | Wt 222.4 lb

## 2012-04-02 DIAGNOSIS — R911 Solitary pulmonary nodule: Secondary | ICD-10-CM

## 2012-04-02 DIAGNOSIS — I2699 Other pulmonary embolism without acute cor pulmonale: Secondary | ICD-10-CM

## 2012-04-02 DIAGNOSIS — R894 Abnormal immunological findings in specimens from other organs, systems and tissues: Secondary | ICD-10-CM

## 2012-04-02 DIAGNOSIS — I82409 Acute embolism and thrombosis of unspecified deep veins of unspecified lower extremity: Secondary | ICD-10-CM

## 2012-04-02 DIAGNOSIS — O2230 Deep phlebothrombosis in pregnancy, unspecified trimester: Secondary | ICD-10-CM

## 2012-04-02 LAB — PROTIME-INR
INR: 1.8 — ABNORMAL LOW (ref 2.00–3.50)
Protime: 21.6 Seconds — ABNORMAL HIGH (ref 10.6–13.4)

## 2012-04-02 LAB — COMPREHENSIVE METABOLIC PANEL (CC13)
Alkaline Phosphatase: 86 U/L (ref 40–150)
Glucose: 92 mg/dl (ref 70–99)
Sodium: 137 mEq/L (ref 136–145)
Total Bilirubin: 0.9 mg/dL (ref 0.20–1.20)
Total Protein: 7.3 g/dL (ref 6.4–8.3)

## 2012-04-02 LAB — CBC WITH DIFFERENTIAL/PLATELET
Eosinophils Absolute: 0.4 10*3/uL (ref 0.0–0.5)
MONO#: 0.7 10*3/uL (ref 0.1–0.9)
MONO%: 5.5 % (ref 0.0–14.0)
NEUT#: 9.7 10*3/uL — ABNORMAL HIGH (ref 1.5–6.5)
RBC: 4.81 10*6/uL (ref 4.20–5.82)
RDW: 15.5 % — ABNORMAL HIGH (ref 11.0–14.6)
WBC: 12.8 10*3/uL — ABNORMAL HIGH (ref 4.0–10.3)
lymph#: 2 10*3/uL (ref 0.9–3.3)
nRBC: 0 % (ref 0–0)

## 2012-04-02 MED ORDER — ENOXAPARIN SODIUM 100 MG/ML ~~LOC~~ SOLN
150.0000 mg | Freq: Once | SUBCUTANEOUS | Status: AC
  Administered 2012-04-02: 150 mg via SUBCUTANEOUS
  Filled 2012-04-02: qty 2

## 2012-04-02 NOTE — Progress Notes (Signed)
This office note has been dictated.

## 2012-04-02 NOTE — Progress Notes (Signed)
CC:   Andrew Clayton. Andrew Clayton, M.D.       Andrew Clayton, M.D.  IDENTIFYING STATEMENT:  The patient is a 41 year old man with extensive thrombosis seen as an inpatient.  He presents for outpatient followup.  HISTORY OF PRESENT ILLNESS:  The patient was admitted on 03/25/2012 after he presented with a 4-day history of left lower extremity pain with swelling.  He had also noted fletting tender "Bumps" on his lower and upper extremities.  Dopplers of both lower extremities were performed on 03/25/2012 that showed acute deep vein thrombosis involving the right lower extremity popliteal, tibial, and peroneal veins; popliteal, femoral, common femoral appeared to continue to the adequate veins.  Superficial thrombosis was also seen to involve the greater saphenous vein of the entire right lower leg, the confluence of the left great saphenous and common femoral vein on the left lower left leg.  Thrombosis of the small veins on the left side were also seen.  Flow throughout the venous system bilaterally appeared mild to minute.  He went on to receive a CT angiogram of the chest that showed tiny filling defects to both lower lobes consistent with acute pulmonary emboli.  In addition, a 5 mm right lower lobe nodule was noted.  The patient was admitted and placed on gtt heparin.  He was subsequently bridged to Xarelto.  While in-house, he received a CT scan of the abdomen and pelvis to rule out occult malignancy on 03/26/2012.  This CT showed hepatosplenomegaly.  The spleen measured 18.4 cm.  He then went on to receive an MRI on 03/28/2012 that essentially demonstrated splenomegaly and a tubular filling defect within the IVC felt to be secondary to thromboembolism.  His portal veins and splenic veins were patent.  CEA level was not elevated.  Echocardiogram revealed an ejection fraction of 55% to 60%.  Hypercoagulable workup performed in the hospital notes the following results:  Lupus anticoagulant was  positive; cardiolipin IgG 5, IgA 10, IgM 2, beta glycoprotein IgG 0, IgA 9, IgM 13, antithrombin-III 84%,. The patient was negative for factor V Leiden and prothrombin gene mutations.  Factor C activity was 113% and protein S activity was 108%. Serum protein electrophoresis did not show monoclonal protein.  The patient was discharged on Xarelto 15 mg twice daily.  He is tolerating the medication well with no bleeding or petechia.  He does have some pain in upper leg although not as intense. He denies lower extremity swelling.  CURRENT MEDICATIONS:  Xarelto 15 mg p.o. twice daily, clonazepam 0.5 mg daily, Adderall 25 mg every 24 hours, Tylenol 325 mg daily.  ALLERGIES:  None.  PAST MEDICAL HISTORY:  ADHD.  SURGERIES:  None.  SOCIAL HISTORY:  The patient is married with 2 children.  He lives in [place].  He an office dispatcher with sedentary lifestyle.  He was a previous smoker, smoked a half-pack a day for 2 years in his 59s. Denies alcohol use.  FAMILY HISTORY:  The patient's father and grandfather had lung cancer.  REVIEW OF SYSTEMS:  As above and rest review of systems negative.  PHYSICAL EXAMINATION:  General:  Alert and oriented x3.  Vitals:  Pulse 94, blood pressure 121/78, temperature 97.2, respirations 18, weight 222 pounds.  HEENT:  Head is atraumatic, normocephalic.  Sclerae anicteric. Mouth moist.  Neck:  Supple.  Chest:  Clear to percussion and auscultation.  CVS is unremarkable.  First and second heart sounds present with no added sounds or murmurs.  Abdomen:  Soft, nontender.  No masses.  Bowel sounds present.  Extremities:  Tender along saphenous vein of left leg. No edema bilaterally.  Pulses present and symmetrical.  IMPRESSION AND PLAN: 1. Andrew Clayton is a 41 year old man with bilateral pulmonary embolism     and bilateral deep vein thrombosis.  He presumably has     antiphospholipid antibody syndrome as his lupus anticoagulant was     positive.  This may be  a transient finding and I plan to repeat     levels in about 3 months' time.  In the interim, he does need     anticoagulation and is on Xarelto.  I discussed his case with Dr.     Maryclare Clayton as I think he may be a candidate for thrombolysis as his     clots are quite acute.  Dr. Bonnielee Haff agrees and the patient will be     admitted on Thursday for in-patient thrombolytic therapy.  He will     therefore discontinue Xarelto and will begin Lovenox at a dose of     1.5 mg/kg daily, total of 150 mg tonight.  He will follow up in 3     months' time for repeat CT angiogram and possibly lower extremity     Dopplers. 2. Lung nodule.  The patient has been scheduled for a PET scan.  He     has a family history for lung cancer.    ______________________________ Laurice Record, M.D. LIO/MEDQ  D:  04/02/2012  T:  04/02/2012  Job:  [id number]

## 2012-04-02 NOTE — Progress Notes (Signed)
Patient came in today as a new patient and he has [employer] insurance,he said that he should be [place] as far as financial assistance.

## 2012-04-02 NOTE — Patient Instructions (Addendum)
Andrew Clayton  [id number]   [facility] - AFTER VISIT SUMMARY   **RECOMMENDATIONS MADE BY THE CONSULTANT AND ANY TEST    RESULTS WILL BE SENT TO YOUR REFERRING DOCTORS.   YOUR EXAM FINDINGS, LABS AND RESULTS WERE DISCUSSED BY YOUR MD TODAY.  YOU CAN GO TO THE [place] WEB SITE FOR INSTRUCTIONS ON HOW TO ASSESS MY CHART FOR ADDITIONAL INFORMATION AS NEEDED.  Your Updated drug allergies are: Allergies as of 04/02/2012  . (No Known Allergies)    Your current list of medications are: Current Outpatient Prescriptions  Medication Sig Dispense Refill  . acetaminophen (TYLENOL) 325 MG tablet Take 650 mg by mouth every 6 (six) hours as needed.      Marland Kitchen amphetamine-dextroamphetamine (ADDERALL XR) 25 MG 24 hr capsule Take 50 mg by mouth daily.      . Multiple Vitamin (MULTIVITAMIN) tablet Take 1 tablet by mouth daily.      . Rivaroxaban (XARELTO) 15 MG TABS tablet Take 1 tablet (15 mg total) by mouth 2 (two) times daily with a meal.  40 tablet  0  . Rivaroxaban (XARELTO) 20 MG TABS Take 1 tablet (20 mg total) by mouth daily with breakfast.  60 tablet  0  . clonazePAM (KLONOPIN) 0.5 MG tablet Take 0.5 mg by mouth daily.         INSTRUCTIONS GIVEN AND DISCUSSED:  See attached schedule   SPECIAL INSTRUCTIONS/FOLLOW-UP:  See above.  I acknowledge that I have been informed and understand all the instructions given to me and received a copy.I know to contact the clinic, my physician, or go to the emergency Department if any problems should occur.   I do not have any more questions at this time, but understand that I may call the [facility] at [phone] during business hours should I have any further questions or need assistance in obtaining follow-up care.

## 2012-04-02 NOTE — Telephone Encounter (Signed)
Gave pt appt for PET Scan on 04/10/12, NPo 6 hours prior to CT then CT angio on January 2014 with labs  then see MD a few days later

## 2012-04-02 NOTE — Progress Notes (Signed)
PCP- Dr. Renne Crigler  Dr. Donell Beers- for ADHD  Preferred contact: Home phone

## 2012-04-03 ENCOUNTER — Encounter (HOSPITAL_COMMUNITY): Payer: Self-pay | Admitting: Pharmacist

## 2012-04-03 ENCOUNTER — Other Ambulatory Visit: Payer: Self-pay | Admitting: Radiology

## 2012-04-04 ENCOUNTER — Other Ambulatory Visit: Payer: Self-pay | Admitting: Hematology and Oncology

## 2012-04-04 ENCOUNTER — Inpatient Hospital Stay (HOSPITAL_COMMUNITY)
Admission: RE | Admit: 2012-04-04 | Discharge: 2012-04-08 | DRG: 549 | Disposition: A | Discharge status: Home / Self Care | Payer: BC Managed Care – PPO | Source: Ambulatory Visit | Attending: Interventional Radiology | Admitting: Interventional Radiology

## 2012-04-04 ENCOUNTER — Encounter (HOSPITAL_COMMUNITY): Payer: Self-pay

## 2012-04-04 ENCOUNTER — Telehealth (HOSPITAL_COMMUNITY): Payer: Self-pay | Admitting: *Deleted

## 2012-04-04 VITALS — BP 106/65 | HR 77 | Temp 98.6°F | Resp 18 | Ht 73.0 in | Wt 215.4 lb

## 2012-04-04 DIAGNOSIS — I82409 Acute embolism and thrombosis of unspecified deep veins of unspecified lower extremity: Secondary | ICD-10-CM

## 2012-04-04 DIAGNOSIS — I2699 Other pulmonary embolism without acute cor pulmonale: Secondary | ICD-10-CM | POA: Diagnosis present

## 2012-04-04 DIAGNOSIS — O2230 Deep phlebothrombosis in pregnancy, unspecified trimester: Secondary | ICD-10-CM

## 2012-04-04 DIAGNOSIS — I82220 Acute embolism and thrombosis of inferior vena cava: Secondary | ICD-10-CM | POA: Diagnosis present

## 2012-04-04 DIAGNOSIS — R894 Abnormal immunological findings in specimens from other organs, systems and tissues: Secondary | ICD-10-CM | POA: Diagnosis present

## 2012-04-04 DIAGNOSIS — I824Y9 Acute embolism and thrombosis of unspecified deep veins of unspecified proximal lower extremity: Principal | ICD-10-CM | POA: Diagnosis present

## 2012-04-04 HISTORY — PX: OTHER SURGICAL HISTORY: SHX169

## 2012-04-04 LAB — CBC
HCT: 42.7 % (ref 39.0–52.0)
HCT: 37.6 % — ABNORMAL LOW (ref 39.0–52.0)
Hemoglobin: 14.5 g/dL (ref 13.0–17.0)
Hemoglobin: 12.5 g/dL — ABNORMAL LOW (ref 13.0–17.0)
MCH: 28.6 pg (ref 26.0–34.0)
MCH: 28.1 pg (ref 26.0–34.0)
MCHC: 34 g/dL (ref 30.0–36.0)
MCV: 84.2 fL (ref 78.0–100.0)
MCV: 84.5 fL (ref 78.0–100.0)
Platelets: 400 10*3/uL (ref 150–400)
Platelets: 344 10*3/uL (ref 150–400)
RBC: 5.07 MIL/uL (ref 4.22–5.81)
RBC: 4.45 MIL/uL (ref 4.22–5.81)
RDW: 15.3 % (ref 11.5–15.5)
WBC: 13.1 10*3/uL — ABNORMAL HIGH (ref 4.0–10.5)
WBC: 13.6 10*3/uL — ABNORMAL HIGH (ref 4.0–10.5)

## 2012-04-04 LAB — PROTIME-INR
INR: 1.1 (ref 0.00–1.49)
Prothrombin Time: 14.1 seconds (ref 11.6–15.2)

## 2012-04-04 LAB — COMPREHENSIVE METABOLIC PANEL WITH GFR
ALT: 24 U/L (ref 0–53)
AST: 21 U/L (ref 0–37)
Albumin: 4.1 g/dL (ref 3.5–5.2)
Alkaline Phosphatase: 82 U/L (ref 39–117)
BUN: 21 mg/dL (ref 6–23)
CO2: 26 meq/L (ref 19–32)
Calcium: 10.3 mg/dL (ref 8.4–10.5)
Chloride: 100 meq/L (ref 96–112)
Creatinine, Ser: 1.14 mg/dL (ref 0.50–1.35)
GFR calc Af Amer: >90 mL/min
GFR calc non Af Amer: 80 mL/min — ABNORMAL LOW
Glucose, Bld: 83 mg/dL (ref 70–99)
Potassium: 4 meq/L (ref 3.5–5.1)
Sodium: 140 meq/L (ref 135–145)
Total Bilirubin: 0.6 mg/dL (ref 0.3–1.2)
Total Protein: 8.4 g/dL — ABNORMAL HIGH (ref 6.0–8.3)

## 2012-04-04 LAB — APTT: aPTT: 48 seconds — ABNORMAL HIGH (ref 24–37)

## 2012-04-04 MED ORDER — IOHEXOL 300 MG/ML  SOLN
100.0000 mL | Freq: Once | INTRAMUSCULAR | Status: AC | PRN
  Administered 2012-04-04: 50 mL via INTRAVENOUS

## 2012-04-04 MED ORDER — TENECTEPLASE 50 MG IV KIT: 0.1500 mg/h | PACK | INTRAVENOUS | Status: DC

## 2012-04-04 MED ORDER — MIDAZOLAM HCL 2 MG/2ML IJ SOLN
INTRAMUSCULAR | Status: AC | PRN
  Administered 2012-04-04 (×3): 2 mg via INTRAVENOUS

## 2012-04-04 MED ORDER — TENECTEPLASE 50 MG IV KIT
0.5000 mg/h | PACK | INTRAVENOUS | Status: DC
  Administered 2012-04-04: 0.5 mg/h via INTRAVENOUS
  Filled 2012-04-04 (×2): qty 1

## 2012-04-04 MED ORDER — TENECTEPLASE 50 MG IV KIT
0.2000 mg/h | PACK | INTRAVENOUS | Status: DC
  Administered 2012-04-05: 0.2 mg/h
  Administered 2012-04-06: 0.3 mg/h
  Administered 2012-04-06: 0.2 mg/h
  Filled 2012-04-04 (×4): qty 0.5

## 2012-04-04 MED ORDER — MIDAZOLAM HCL 2 MG/2ML IJ SOLN
INTRAMUSCULAR | Status: AC
  Filled 2012-04-04: qty 6

## 2012-04-04 MED ORDER — MORPHINE SULFATE 4 MG/ML IJ SOLN
5.0000 mg | INTRAMUSCULAR | Status: DC | PRN
  Administered 2012-04-05 (×5): 5 mg via INTRAVENOUS
  Administered 2012-04-06: 4 mg via INTRAVENOUS
  Administered 2012-04-06: 5 mg via INTRAVENOUS
  Administered 2012-04-06 – 2012-04-07 (×3): 4 mg via INTRAVENOUS
  Filled 2012-04-04 (×3): qty 2
  Filled 2012-04-04: qty 1
  Filled 2012-04-04 (×2): qty 2
  Filled 2012-04-04: qty 1
  Filled 2012-04-04: qty 2
  Filled 2012-04-04 (×2): qty 1

## 2012-04-04 MED ORDER — SODIUM CHLORIDE 0.9 % IJ SOLN
3.0000 mL | Freq: Two times a day (BID) | INTRAMUSCULAR | Status: DC
  Administered 2012-04-05 – 2012-04-08 (×4): 3 mL via INTRAVENOUS

## 2012-04-04 MED ORDER — SODIUM CHLORIDE 0.9 % IV SOLN: Freq: Once | INTRAVENOUS | Status: DC

## 2012-04-04 MED ORDER — FENTANYL CITRATE 0.05 MG/ML IJ SOLN
INTRAMUSCULAR | Status: AC | PRN
  Administered 2012-04-04 (×2): 50 ug via INTRAVENOUS
  Administered 2012-04-04: 100 ug via INTRAVENOUS

## 2012-04-04 MED ORDER — CLONAZEPAM 0.5 MG PO TABS
0.5000 mg | ORAL_TABLET | Freq: Every day | ORAL | Status: DC
  Filled 2012-04-04: qty 1

## 2012-04-04 MED ORDER — TENECTEPLASE 50 MG IV KIT
0.2000 mg/h | PACK | INTRAVENOUS | Status: DC
  Administered 2012-04-05: 0.15 mg/h
  Administered 2012-04-07: 0.2 mg/h
  Filled 2012-04-04 (×6): qty 0.5

## 2012-04-04 MED ORDER — ENOXAPARIN SODIUM 150 MG/ML ~~LOC~~ SOLN: 150.0000 mg | Freq: Every day | SUBCUTANEOUS | Status: DC

## 2012-04-04 MED ORDER — HEPARIN (PORCINE) IN NACL 100-0.45 UNIT/ML-% IJ SOLN
1000.0000 [IU]/h | INTRAMUSCULAR | Status: DC
  Administered 2012-04-04: 800 [IU]/h via INTRAVENOUS
  Filled 2012-04-04 (×2): qty 250

## 2012-04-04 MED ORDER — SODIUM CHLORIDE 0.9 % IJ SOLN
3.0000 mL | INTRAMUSCULAR | Status: DC | PRN
  Administered 2012-04-08: 3 mL via INTRAVENOUS

## 2012-04-04 MED ORDER — ONDANSETRON HCL 4 MG/2ML IJ SOLN
4.0000 mg | Freq: Four times a day (QID) | INTRAMUSCULAR | Status: DC | PRN
  Filled 2012-04-04: qty 2

## 2012-04-04 MED ORDER — MIDAZOLAM HCL 2 MG/2ML IJ SOLN: 1.0000 mg | INTRAMUSCULAR | Status: DC | PRN

## 2012-04-04 MED ORDER — SODIUM CHLORIDE 0.9 % IV SOLN
250.0000 mL | INTRAVENOUS | Status: DC | PRN
  Administered 2012-04-05: 500 mL via INTRAVENOUS

## 2012-04-04 MED ORDER — HEPARIN (PORCINE) IN NACL 100-0.45 UNIT/ML-% IJ SOLN
800.0000 [IU]/h | INTRAMUSCULAR | Status: DC
  Administered 2012-04-04: 800 [IU]/h via INTRAVENOUS
  Filled 2012-04-04 (×2): qty 250

## 2012-04-04 MED ORDER — FENTANYL CITRATE 0.05 MG/ML IJ SOLN
INTRAMUSCULAR | Status: AC
  Filled 2012-04-04: qty 6

## 2012-04-04 NOTE — Progress Notes (Signed)
ANTICOAGULATION CONSULT NOTE - Initial Consult  Pharmacy Consult for Heparin Indication: s/p thrombolysis with TNKase in IR  No Known Allergies  Patient Measurements: Height: 6\' 1"  (185.4 cm) Weight: 221 lb (100.245 kg) IBW/kg (Calculated) : 79.9  Heparin Dosing Weight: 100kg  Vital Signs: Temp: 97.7 F (36.5 C) (10/10 1338) Temp src: Oral (10/10 1338) BP: 116/69 mmHg (10/10 1900) Pulse Rate: 89  (10/10 1900)  Labs:  Basename 04/04/12 1334 04/02/12 1422  HGB 14.5 13.5  HCT 42.7 42.4  PLT 400 340  APTT 48* --  LABPROT 14.1 --  INR 1.10 1.80*  HEPARINUNFRC -- --  CREATININE 1.14 1.1  CKTOTAL -- --  CKMB -- --  TROPONINI -- --    Estimated Creatinine Clearance: 108.3 ml/min (by C-G formula based on Cr of 1.14).   Medical History: Past Medical History  Diagnosis Date  . ADHD (attention deficit hyperactivity disorder)      Assessment: 20 yom admitted 03/25/12 with new b/l DVT and PE.  He was d/c on rivaroxaban with f/u with hematology.  He was found to be lupus anticoagulant positive.  He had large clot burden and presented [facility] for thromb[facility] in IR.  He is current;y stable and talking with family.  Post IR TNKase running at 0.2mg /hr in left leg and 0.15mg /hr in the right leg. Heparin drip started at 1900 at 800 uts/hr with no bleeding noted.   Goal of Therapy:  HL 0.2-0.5 while on TNKase Monitor platelets by anticoagulation protocol: Yes   Plan:   Continue Heparin 800 uts/hr  Check HL  6hr after start and daily Daily CBC   Marcelino Scot 04/04/2012,7:31 PM

## 2012-04-04 NOTE — Addendum Note (Signed)
Addended by: Barbara Cower on: 04/04/2012 02:51 PM   Modules accepted: Orders

## 2012-04-04 NOTE — H&P (Signed)
Andrew Clayton is an 41 y.o. male.   Chief Complaint: Lt leg pain for 2 weeks Admitted 03/25/12 for Bilat DVT and subsequent pulmonary embolus Hospitalized on Hep x 1 week and discharged on Xeralto Pain has decreased but remains mostly in Rt thigh Dr Dalene Carrow has requested evaluation and possible treatment + antiphospholipid antibody Scheduled now for Bilat lower extremity venogram with probable transcatheter thrombolysis and possible mechanical thrombectomy with possible angioplasty/stent placement Coincidental finding of rt lung nodule during hospitalization HPI: ADHD; B PE; B low extr dvt; antiphospholipid antibody  Past Medical History  Diagnosis Date  . ADHD (attention deficit hyperactivity disorder)     Past Surgical History  Procedure Date  . No past surgeries     Family History  Problem Relation Age of Onset  . Pulmonary embolism Neg Hx    Social History:  reports that he has never smoked. He does not have any smokeless tobacco history on file. He reports that he does not drink alcohol. His drug history not on file.  Allergies: No Known Allergies   (Not in a hospital admission)  Results for orders placed during the hospital encounter of 04/04/12 (from the past 48 hour(s))  APTT     Status: Abnormal   Collection Time   04/04/12  1:34 PM      Component Value Range Comment   aPTT 48 (*) 24 - 37 seconds   CBC     Status: Abnormal   Collection Time   04/04/12  1:34 PM      Component Value Range Comment   WBC 13.1 (*) 4.0 - 10.5 K/uL    RBC 5.07  4.22 - 5.81 MIL/uL    Hemoglobin 14.5  13.0 - 17.0 g/dL    HCT 16.1  09.6 - 04.5 %    MCV 84.2  78.0 - 100.0 fL    MCH 28.6  26.0 - 34.0 pg    MCHC 34.0  30.0 - 36.0 g/dL    RDW 40.9  81.1 - 91.4 %    Platelets 400  150 - 400 K/uL   PROTIME-INR     Status: Normal   Collection Time   04/04/12  1:34 PM      Component Value Range Comment   Prothrombin Time 14.1  11.6 - 15.2 seconds    INR 1.10  0.00 - 1.49    No results  found.  Review of Systems  Constitutional: Negative for fever.  Respiratory: Negative for shortness of breath.   Cardiovascular: Negative for chest pain.  Gastrointestinal: Negative for nausea, vomiting and abdominal pain.  Musculoskeletal: Positive for back pain and joint pain.       Left leg pain  Neurological: Negative for headaches.    Blood pressure 117/74, pulse 96, temperature 97.7 F (36.5 C), temperature source Oral, resp. rate 16, height 6\' 1"  (1.854 m), weight 221 lb (100.245 kg), SpO2 100.00%. Physical Exam  Constitutional: He is oriented to person, place, and time. He appears well-developed and well-nourished.  Cardiovascular: Normal rate, regular rhythm and normal heart sounds.   No murmur heard. Respiratory: Effort normal and breath sounds normal. He has no wheezes.  GI: Soft. Bowel sounds are normal. There is no tenderness.  Musculoskeletal: Normal range of motion.       From; Lt leg painful to touch med thigh area; + palpable knot in med thigh Both legs are pink; warm; no real swelling noted  Neurological: He is alert and oriented to person, place, and time.  Skin:       Noticeable small red macules over Bilat distal lower extrs  Psychiatric: He has a normal mood and affect. His behavior is normal. Judgment and thought content normal.     Assessment/Plan Pt admitted for B low extr dvt and B PE 03/25/12 Heparin x 1 week - dc'd on Xeralto + antiphospholipid antibody Now scheduled for B low extr venogram with prob lysis and poss pta/stent Per Dr Dalene Carrow. Pt and wife aware of procedure benefits and risks and agreeable to proceed. Consent signed and in chart  Raaga Maeder A 04/04/2012, 2:39 PM

## 2012-04-04 NOTE — Procedures (Signed)
Bilat lower extr venography Catheter directed thrombolysis No complication No blood loss. See complete dictation in [facility].

## 2012-04-05 ENCOUNTER — Encounter (HOSPITAL_COMMUNITY): Payer: Self-pay

## 2012-04-05 ENCOUNTER — Observation Stay (HOSPITAL_COMMUNITY): Payer: BC Managed Care – PPO

## 2012-04-05 DIAGNOSIS — R894 Abnormal immunological findings in specimens from other organs, systems and tissues: Secondary | ICD-10-CM

## 2012-04-05 DIAGNOSIS — I82409 Acute embolism and thrombosis of unspecified deep veins of unspecified lower extremity: Secondary | ICD-10-CM

## 2012-04-05 LAB — CBC
HCT: 36.3 % — ABNORMAL LOW (ref 39.0–52.0)
Hemoglobin: 12.1 g/dL — ABNORMAL LOW (ref 13.0–17.0)
MCHC: 33.3 g/dL (ref 30.0–36.0)
RBC: 4.31 MIL/uL (ref 4.22–5.81)
WBC: 12 10*3/uL — ABNORMAL HIGH (ref 4.0–10.5)

## 2012-04-05 LAB — FIBRINOGEN
Fibrinogen: 644 mg/dL — ABNORMAL HIGH (ref 204–475)
Fibrinogen: 644 mg/dL — ABNORMAL HIGH (ref 204–475)
Fibrinogen: 613 mg/dL — ABNORMAL HIGH (ref 204–475)
Fibrinogen: 459 mg/dL (ref 204–475)

## 2012-04-05 LAB — GLUCOSE, CAPILLARY
Glucose-Capillary: 91 mg/dL (ref 70–99)
Glucose-Capillary: 91 mg/dL (ref 70–99)
Glucose-Capillary: 112 mg/dL — ABNORMAL HIGH (ref 70–99)

## 2012-04-05 LAB — HEPARIN LEVEL (UNFRACTIONATED)
Heparin Unfractionated: 0.14 IU/mL — ABNORMAL LOW (ref 0.30–0.70)
Heparin Unfractionated: <0.1 IU/mL — ABNORMAL LOW (ref 0.30–0.70)

## 2012-04-05 LAB — MRSA PCR SCREENING: MRSA by PCR: POSITIVE — AB

## 2012-04-05 MED ORDER — SODIUM CHLORIDE 0.9 % IV SOLN
INTRAVENOUS | Status: DC
  Administered 2012-04-05: 09:00:00 via INTRAVENOUS

## 2012-04-05 MED ORDER — FENTANYL CITRATE 0.05 MG/ML IJ SOLN
INTRAMUSCULAR | Status: AC
  Filled 2012-04-05: qty 4

## 2012-04-05 MED ORDER — HEPARIN (PORCINE) IN NACL 100-0.45 UNIT/ML-% IJ SOLN
1400.0000 [IU]/h | INTRAMUSCULAR | Status: DC
  Administered 2012-04-05: 1400 [IU]/h via INTRAVENOUS
  Filled 2012-04-05: qty 250

## 2012-04-05 MED ORDER — IOHEXOL 300 MG/ML  SOLN
50.0000 mL | Freq: Once | INTRAMUSCULAR | Status: AC | PRN
  Administered 2012-04-05: 30 mL via INTRAVENOUS

## 2012-04-05 MED ORDER — MUPIROCIN 2 % EX OINT
1.0000 "application " | TOPICAL_OINTMENT | Freq: Two times a day (BID) | CUTANEOUS | Status: DC
  Administered 2012-04-05 – 2012-04-08 (×7): 1 via NASAL
  Filled 2012-04-05 (×2): qty 22

## 2012-04-05 MED ORDER — MIDAZOLAM HCL 2 MG/2ML IJ SOLN
INTRAMUSCULAR | Status: AC
  Filled 2012-04-05: qty 4

## 2012-04-05 MED ORDER — HEPARIN (PORCINE) IN NACL 100-0.45 UNIT/ML-% IJ SOLN
1300.0000 [IU]/h | INTRAMUSCULAR | Status: DC
  Filled 2012-04-05: qty 250

## 2012-04-05 MED ORDER — MIDAZOLAM HCL 2 MG/2ML IJ SOLN
INTRAMUSCULAR | Status: AC | PRN
  Administered 2012-04-05: 1 mg via INTRAVENOUS

## 2012-04-05 MED ORDER — CHLORHEXIDINE GLUCONATE CLOTH 2 % EX PADS
6.0000 | MEDICATED_PAD | Freq: Every day | CUTANEOUS | Status: DC
  Administered 2012-04-05 – 2012-04-08 (×4): 6 via TOPICAL

## 2012-04-05 NOTE — Procedures (Signed)
Proc: Venous Lysis Check:  Find: Improvement. Persistent B Iliac narrowing extending to lower IVC.  Plan: [place] overnight. Re check tomorrow.  No comp

## 2012-04-05 NOTE — Progress Notes (Addendum)
ANTICOAGULATION CONSULT NOTE - Follow Up Consult  Pharmacy Consult for heparin Indication: s/p thrombolysis and concurrent TNKase  No Known Allergies  Patient Measurements: Height: 6\' 1"  (185.4 cm) Weight: 212 lb 1.3 oz (96.2 kg) IBW/kg (Calculated) : 79.9  Heparin Dosing Weight: 100 kg  Vital Signs: Temp: 97.8 F (36.6 C) (10/11 0803) Temp src: Oral (10/11 0803) BP: 107/63 mmHg (10/11 0800) Pulse Rate: 78  (10/11 0800)  Labs:  Basename 04/05/12 0600 04/05/12 04/04/12 1858 04/04/12 1334 04/02/12 1422  HGB 12.1* -- 12.5* -- --  HCT 36.3* -- 37.6* 42.7 --  PLT 324 -- 344 400 --  APTT -- -- -- 48* --  LABPROT -- -- -- 14.1 --  INR -- -- -- 1.10 1.80*  HEPARINUNFRC 0.14* 0.15* -- -- --  CREATININE -- -- -- 1.14 1.1  CKTOTAL -- -- -- -- --  CKMB -- -- -- -- --  TROPONINI -- -- -- -- --    Estimated Creatinine Clearance: 106.3 ml/min (by C-G formula based on Cr of 1.14).   Assessment: 41 yo male subtherapeutic on heparin 1000 units/hr s/p thrombolysis with concurrent TNKase. He had a large clot burden and + hypercoagulability studies. Patient required 2900 units/hr to be in goal range during prior hospitalization earlier this month (10/3) which may explain why he is not therapeutic. No bleeding reported. Hgb 12.1, HCT 36.3, Plt 324, all currently stable.   Goal of Therapy:  Heparin level 0.2-0.5 units/ml due to concurrent TNKase Monitor platelets by anticoagulation protocol: yes   Plan:  Increase heparin to 1300 units/hr Check heparin level at 1200 Follow daily CBC  Juanita Craver PharmD Candidate 04/05/2012,8:37 AM  I have reviewed the above and agree with the plan.  Harland German, Pharm D 04/05/2012 9:19 AM   addendum -Heparin level reported as < 0.1 at noon (q6h heparin levels ordered).  Infusion was increased to 1300 units at about 10am today  Plan -d/c q6h heparin levels -Increase heparin to 1400 units/hr -Check a heparin level in [address]  [place] D 04/05/2012 2:26 PM

## 2012-04-05 NOTE — Progress Notes (Signed)
Subjective: Pt resting quietly; no new c/o  Objective: Vital signs in last 24 hours: Temp:  [97.7 F (36.5 C)-97.9 F (36.6 C)] 97.8 F (36.6 C) (10/11 0803) Pulse Rate:  [73-98] 98  (10/11 1300) Resp:  [6-22] 16  (10/11 1300) BP: (101-142)/(56-93) 112/68 mmHg (10/11 1300) SpO2:  [94 %-100 %] 96 % (10/11 1300) Weight:  [212 lb 1.3 oz (96.2 kg)] 212 lb 1.3 oz (96.2 kg) (10/11 0600)    Intake/Output from previous day: 10/10 0701 - 10/11 0700 In: 1363.3 [P.O.:480; I.V.:391.7; IV Piggyback:491.7] Out: 500 [Urine:500] Intake/Output this shift: Total I/O In: 553.5 [I.V.:303.8; IV Piggyback:249.7] Out: 1000 [Urine:1000]  Pt awake/alert; chest - CTA bilat; heart- RRR; abd-soft,+BS,NT; ext with trace bilat LE edema; venous popliteal infusion catheters intact, dressings dry,sites mildly tender                                                                                                             Lab Results:   Basename 04/05/12 0600 04/04/12 1858  WBC 12.0* 13.6*  HGB 12.1* 12.5*  HCT 36.3* 37.6*  PLT 324 344   BMET  Basename 04/04/12 1334  NA 140  K 4.0  CL 100  CO2 26  GLUCOSE 83  BUN 21  CREATININE 1.14  CALCIUM 10.3   PT/INR  Basename 04/04/12 1334  LABPROT 14.1  INR 1.10   ABG No results found for this basename: PHART:2,PCO2:2,PO2:2,HCO3:2 in the last 72 hours  Studies/Results: Ir Angiogram Extremity Bilateral  04/05/2012  *RADIOLOGY REPORT*  Clinical Data: Symptomatic bilateral lower extremity DVT and caval thrombus.  BILATERAL LOWER EXTREMITY VENOGRAPHY ULTRASOUND GUIDED VASCULAR ACCESS X2 INITIATION OF CATHETER DIRECTED THROMBOLYSIS  Comparison: MR 03/28/2012 and earlier studies  Technique and findings: The procedure, risks (including but not limited to bleeding, infection, organ damage), benefits, and alternatives were explained to the patient.  Questions regarding the procedure were encouraged and answered.  The patient understands and consents to the  procedure.The posterior popliteal regions were prepped and draped usual sterile fashion. Maximal barrier sterile technique was utilized including caps, mask, sterile gowns, sterile gloves, sterile drape, hand hygiene and skin antiseptic.  Intravenous Fentanyl and Versed were administered as conscious sedation during continuous cardiorespiratory monitoring by the radiology RN, with a total moderate sedation time of 40 minutes.  Under real time ultrasound guidance, the left popliteal vein was accessed with a 21-gauge micropuncture needle, exchanged over a 018 guide wire for a transitional dilator for left lower extremity venography. The dilator was exchanged for 5-French Kumpe catheter for additional left iliocaval venography.  Left lower extremity venography demonstrated partially occlusive thrombus in the popliteal vein above the knee extending partially through   the femoral vein .  The femoral vein is duplicated, the smaller moiety been somewhat irregular.  Near the confluence of the two moieties there is additional   acute and chronic thrombus which extends more extensively through the common femoral vein, the thrombus in this segment nearly occlusive. Little iliac venous clot.  There is significant tapered narrowing of the left iliac  vein near its confluence with the IVC.  There is   partially occlusive thrombus in the IVC just above the iliac confluence. The suprarenal IVC is widely patent.  In similar fashion, the right popliteal vein was accessed with a 21- gauge micropuncture needle, exchanged over a 018 guide wire for a transitional dilator for right lower extremity venography.  Right lower extremity venography demonstrates scattered nonocclusive thrombus in the superficial femoral vein and common femoral vein.  Minimal thrombus in the iliac venous system. Despite the fairly extensive infrarenal caval thrombus, there is good flow into the more central IVC.  Both catheters were then exchanged for 6-French  vascular sheaths, which allowed placement of infusion catheters, using a 90 cm catheter on the left with 50 cm infusion length, and a 90/ 30 catheter on the right.  Catheter directed venous thrombolysis with tenecteplase was then initiated and the patient   admitted to the ICU for overnight infusion. The patient tolerated the procedure well.  No immediate complication.  IMPRESSION:  1.  Large amount of infrarenal IVC partially occlusive thrombus. 2.  Partially occlusive femoropopliteal DVT, worse left than right. 3.  Initiation of bilateral lower extremity catheter directed venous thrombolysis.  Plans for follow-up venography on 04/05/2012.   Original Report Authenticated By: Osa Craver, M.D.    Ir US Guide Vasc Access Left  04/05/2012  *RADIOLOGY REPORT*  Clinical Data: Symptomatic bilateral lower extremity DVT and caval thrombus.  BILATERAL LOWER EXTREMITY VENOGRAPHY ULTRASOUND GUIDED VASCULAR ACCESS X2 INITIATION OF CATHETER DIRECTED THROMBOLYSIS  Comparison: MR 03/28/2012 and earlier studies  Technique and findings: The procedure, risks (including but not limited to bleeding, infection, organ damage), benefits, and alternatives were explained to the patient.  Questions regarding the procedure were encouraged and answered.  The patient understands and consents to the procedure.The posterior popliteal regions were prepped and draped usual sterile fashion. Maximal barrier sterile technique was utilized including caps, mask, sterile gowns, sterile gloves, sterile drape, hand hygiene and skin antiseptic.  Intravenous Fentanyl and Versed were administered as conscious sedation during continuous cardiorespiratory monitoring by the radiology RN, with a total moderate sedation time of 40 minutes.  Under real time ultrasound guidance, the left popliteal vein was accessed with a 21-gauge micropuncture needle, exchanged over a 018 guide wire for a transitional dilator for left lower extremity venography. The  dilator was exchanged for 5-French Kumpe catheter for additional left iliocaval venography.  Left lower extremity venography demonstrated partially occlusive thrombus in the popliteal vein above the knee extending partially through   the femoral vein .  The femoral vein is duplicated, the smaller moiety been somewhat irregular.  Near the confluence of the two moieties there is additional   acute and chronic thrombus which extends more extensively through the common femoral vein, the thrombus in this segment nearly occlusive. Little iliac venous clot.  There is significant tapered narrowing of the left iliac vein near its confluence with the IVC.  There is   partially occlusive thrombus in the IVC just above the iliac confluence. The suprarenal IVC is widely patent.  In similar fashion, the right popliteal vein was accessed with a 21- gauge micropuncture needle, exchanged over a 018 guide wire for a transitional dilator for right lower extremity venography.  Right lower extremity venography demonstrates scattered nonocclusive thrombus in the superficial femoral vein and common femoral vein.  Minimal thrombus in the iliac venous system. Despite the fairly extensive infrarenal caval thrombus, there is good flow into the more  central IVC.  Both catheters were then exchanged for 6-French vascular sheaths, which allowed placement of infusion catheters, using a 90 cm catheter on the left with 50 cm infusion length, and a 90/ 30 catheter on the right.  Catheter directed venous thrombolysis with tenecteplase was then initiated and the patient   admitted to the ICU for overnight infusion. The patient tolerated the procedure well.  No immediate complication.  IMPRESSION:  1.  Large amount of infrarenal IVC partially occlusive thrombus. 2.  Partially occlusive femoropopliteal DVT, worse left than right. 3.  Initiation of bilateral lower extremity catheter directed venous thrombolysis.  Plans for follow-up venography on  04/05/2012.   Original Report Authenticated By: Osa Craver, M.D.    Ir US Guide Vasc Access Right  04/05/2012  *RADIOLOGY REPORT*  Clinical Data: Symptomatic bilateral lower extremity DVT and caval thrombus.  BILATERAL LOWER EXTREMITY VENOGRAPHY ULTRASOUND GUIDED VASCULAR ACCESS X2 INITIATION OF CATHETER DIRECTED THROMBOLYSIS  Comparison: MR 03/28/2012 and earlier studies  Technique and findings: The procedure, risks (including but not limited to bleeding, infection, organ damage), benefits, and alternatives were explained to the patient.  Questions regarding the procedure were encouraged and answered.  The patient understands and consents to the procedure.The posterior popliteal regions were prepped and draped usual sterile fashion. Maximal barrier sterile technique was utilized including caps, mask, sterile gowns, sterile gloves, sterile drape, hand hygiene and skin antiseptic.  Intravenous Fentanyl and Versed were administered as conscious sedation during continuous cardiorespiratory monitoring by the radiology RN, with a total moderate sedation time of 40 minutes.  Under real time ultrasound guidance, the left popliteal vein was accessed with a 21-gauge micropuncture needle, exchanged over a 018 guide wire for a transitional dilator for left lower extremity venography. The dilator was exchanged for 5-French Kumpe catheter for additional left iliocaval venography.  Left lower extremity venography demonstrated partially occlusive thrombus in the popliteal vein above the knee extending partially through   the femoral vein .  The femoral vein is duplicated, the smaller moiety been somewhat irregular.  Near the confluence of the two moieties there is additional   acute and chronic thrombus which extends more extensively through the common femoral vein, the thrombus in this segment nearly occlusive. Little iliac venous clot.  There is significant tapered narrowing of the left iliac vein near its  confluence with the IVC.  There is   partially occlusive thrombus in the IVC just above the iliac confluence. The suprarenal IVC is widely patent.  In similar fashion, the right popliteal vein was accessed with a 21- gauge micropuncture needle, exchanged over a 018 guide wire for a transitional dilator for right lower extremity venography.  Right lower extremity venography demonstrates scattered nonocclusive thrombus in the superficial femoral vein and common femoral vein.  Minimal thrombus in the iliac venous system. Despite the fairly extensive infrarenal caval thrombus, there is good flow into the more central IVC.  Both catheters were then exchanged for 6-French vascular sheaths, which allowed placement of infusion catheters, using a 90 cm catheter on the left with 50 cm infusion length, and a 90/ 30 catheter on the right.  Catheter directed venous thrombolysis with tenecteplase was then initiated and the patient   admitted to the ICU for overnight infusion. The patient tolerated the procedure well.  No immediate complication.  IMPRESSION:  1.  Large amount of infrarenal IVC partially occlusive thrombus. 2.  Partially occlusive femoropopliteal DVT, worse left than right. 3.  Initiation of bilateral lower  extremity catheter directed venous thrombolysis.  Plans for follow-up venography on 04/05/2012.   Original Report Authenticated By: Osa Craver, M.D.    Ir Infusion Thrombol Arterial Initial (ms)  04/05/2012  *RADIOLOGY REPORT*  Clinical Data: Symptomatic bilateral lower extremity DVT and caval thrombus.  BILATERAL LOWER EXTREMITY VENOGRAPHY ULTRASOUND GUIDED VASCULAR ACCESS X2 INITIATION OF CATHETER DIRECTED THROMBOLYSIS  Comparison: MR 03/28/2012 and earlier studies  Technique and findings: The procedure, risks (including but not limited to bleeding, infection, organ damage), benefits, and alternatives were explained to the patient.  Questions regarding the procedure were encouraged and answered.   The patient understands and consents to the procedure.The posterior popliteal regions were prepped and draped usual sterile fashion. Maximal barrier sterile technique was utilized including caps, mask, sterile gowns, sterile gloves, sterile drape, hand hygiene and skin antiseptic.  Intravenous Fentanyl and Versed were administered as conscious sedation during continuous cardiorespiratory monitoring by the radiology RN, with a total moderate sedation time of 40 minutes.  Under real time ultrasound guidance, the left popliteal vein was accessed with a 21-gauge micropuncture needle, exchanged over a 018 guide wire for a transitional dilator for left lower extremity venography. The dilator was exchanged for 5-French Kumpe catheter for additional left iliocaval venography.  Left lower extremity venography demonstrated partially occlusive thrombus in the popliteal vein above the knee extending partially through   the femoral vein .  The femoral vein is duplicated, the smaller moiety been somewhat irregular.  Near the confluence of the two moieties there is additional   acute and chronic thrombus which extends more extensively through the common femoral vein, the thrombus in this segment nearly occlusive. Little iliac venous clot.  There is significant tapered narrowing of the left iliac vein near its confluence with the IVC.  There is   partially occlusive thrombus in the IVC just above the iliac confluence. The suprarenal IVC is widely patent.  In similar fashion, the right popliteal vein was accessed with a 21- gauge micropuncture needle, exchanged over a 018 guide wire for a transitional dilator for right lower extremity venography.  Right lower extremity venography demonstrates scattered nonocclusive thrombus in the superficial femoral vein and common femoral vein.  Minimal thrombus in the iliac venous system. Despite the fairly extensive infrarenal caval thrombus, there is good flow into the more central IVC.  Both  catheters were then exchanged for 6-French vascular sheaths, which allowed placement of infusion catheters, using a 90 cm catheter on the left with 50 cm infusion length, and a 90/ 30 catheter on the right.  Catheter directed venous thrombolysis with tenecteplase was then initiated and the patient   admitted to the ICU for overnight infusion. The patient tolerated the procedure well.  No immediate complication.  IMPRESSION:  1.  Large amount of infrarenal IVC partially occlusive thrombus. 2.  Partially occlusive femoropopliteal DVT, worse left than right. 3.  Initiation of bilateral lower extremity catheter directed venous thrombolysis.  Plans for follow-up venography on 04/05/2012.   Original Report Authenticated By: Osa Craver, M.D.     Anti-infectives: Anti-infectives    None      Assessment:  LOS: 1 day Pt s/p initiation of TNK thrombolysis for symptomatic bilateral LE DVT/infrarenal caval thrombus 10/10; currently stable;cont TNK/IV heparin per pharmacy dosing. Plan f/u venography later today to assess progress.   Hillarie Harrigan,D [facility] 04/05/2012

## 2012-04-05 NOTE — Progress Notes (Signed)
CRITICAL VALUE ALERT  Critical value received:  MRSA + nasal swab  Date of notification:  04/05/2012  Time of notification:  0300  Critical value read back:yes  Nurse who received alert:  Leonia Reeves  MD notified (1st page):  Dr. Craige Cotta  Time of first page:  [phone]

## 2012-04-05 NOTE — Progress Notes (Signed)
SUBJECTIVE:  Resting comfortably.  Voices no complaints.    Admitted for thrombolytic therapy for extensive thrombosis involving bilateral lower extremities  And infrarenal thrombus.     MEDICATIONS:  Scheduled:   . sodium chloride   Intravenous Once  . Chlorhexidine Gluconate Cloth  6 each Topical Q0600  . clonazePAM  0.5 mg Oral QHS  . fentaNYL      . midazolam      . mupirocin ointment  1 application Nasal BID  . sodium chloride  3 mL Intravenous Q12H   Continuous:   . sodium chloride 30 mL/hr at 04/05/12 0855  . heparin 1,400 Units/hr (04/05/12 1500)  . tenecteplase (TNKase) infusion 0.2 mg/hr (04/04/12 2114)  . tenecteplase (TNKase) infusion 0.15 mg/hr (04/05/12 1452)  . DISCONTD: heparin 800 Units/hr (04/04/12 1825)  . DISCONTD: heparin 1,000 Units/hr (04/05/12 0130)  . DISCONTD: heparin 1,300 Units/hr (04/05/12 1014)  . DISCONTD: tenecteplase (TNKase) infusion    . DISCONTD: tenecteplase (TNKase) infusion 0.5 mg/hr (04/04/12 1810)   BJY:NWGNFA chloride, fentaNYL, iohexol, midazolam, midazolam, morphine injection, ondansetron (ZOFRAN) IV, sodium chloride   PHYSICAL EXAM  Vital signs in last 24 hours: Temp:  [97.7 F (36.5 C)-97.9 F (36.6 C)] 97.8 F (36.6 C) (10/11 0803) Pulse Rate:  [73-98] 88  (10/11 1500) Resp:  [6-22] 17  (10/11 1500) BP: (101-142)/(56-93) 106/59 mmHg (10/11 1500) SpO2:  [94 %-100 %] 96 % (10/11 1500) Weight:  [212 lb 1.3 oz (96.2 kg)] 212 lb 1.3 oz (96.2 kg) (10/11 0600)  Intake/Output from previous day: 10/10 0701 - 10/11 0700 In: 1363.3 [P.O.:480; I.V.:391.7; IV Piggyback:491.7] Out: 500 [Urine:500] Intake/Output this shift: Total I/O In: 641.5 [I.V.:356.8; IV Piggyback:284.7] Out: 1000 [Urine:1000]  General appearance: alert and cooperative Resp: clear to auscultation bilaterally and normal percussion bilaterally Cardio: regular rate and rhythm, S1, S2 normal, no murmur, click, rub or gallop GI: soft, non-tender; bowel sounds  normal; no masses,  no organomegaly Extremities: extremities normal, atraumatic, no cyanosis or edema   LABS:  CBC    Component Value Date/Time   WBC 12.0* 04/05/2012 0600   WBC 12.8* 04/02/2012 1422   RBC 4.31 04/05/2012 0600   RBC 4.81 04/02/2012 1422   HGB 12.1* 04/05/2012 0600   HGB 13.5 04/02/2012 1422   HCT 36.3* 04/05/2012 0600   HCT 42.4 04/02/2012 1422   PLT 324 04/05/2012 0600   PLT 340 04/02/2012 1422   MCV 84.2 04/05/2012 0600   MCV 88.1 04/02/2012 1422   MCH 28.1 04/05/2012 0600   MCH 28.1 04/02/2012 1422   MCHC 33.3 04/05/2012 0600   MCHC 31.8* 04/02/2012 1422   RDW 15.3 04/05/2012 0600   RDW 15.5* 04/02/2012 1422   LYMPHSABS 2.0 04/02/2012 1422   LYMPHSABS 2.0 03/25/2012 1441   MONOABS 0.7 04/02/2012 1422   MONOABS 1.0 03/25/2012 1441   EOSABS 0.4 04/02/2012 1422   EOSABS 0.5 03/25/2012 1441   BASOSABS 0.1 04/02/2012 1422   BASOSABS 0.1 03/25/2012 1441     Basename 04/04/12 1334  NA 140  K 4.0  CL 100  CO2 26  GLUCOSE 83  BUN 21  CREATININE 1.14  CALCIUM 10.3     XRAYS/RESULTS: Ir Angiogram Extremity Bilateral  04/05/2012  *RADIOLOGY REPORT*  Clinical Data: Symptomatic bilateral lower extremity DVT and caval thrombus.  BILATERAL LOWER EXTREMITY VENOGRAPHY ULTRASOUND GUIDED VASCULAR ACCESS X2 INITIATION OF CATHETER DIRECTED THROMBOLYSIS  Comparison: MR 03/28/2012 and earlier studies  Technique and findings: The procedure, risks (including but not limited to bleeding, infection, organ  damage), benefits, and alternatives were explained to the patient.  Questions regarding the procedure were encouraged and answered.  The patient understands and consents to the procedure.The posterior popliteal regions were prepped and draped usual sterile fashion. Maximal barrier sterile technique was utilized including caps, mask, sterile gowns, sterile gloves, sterile drape, hand hygiene and skin antiseptic.  Intravenous Fentanyl and Versed were administered as conscious sedation  during continuous cardiorespiratory monitoring by the radiology RN, with a total moderate sedation time of 40 minutes.  Under real time ultrasound guidance, the left popliteal vein was accessed with a 21-gauge micropuncture needle, exchanged over a 018 guide wire for a transitional dilator for left lower extremity venography. The dilator was exchanged for 5-French Kumpe catheter for additional left iliocaval venography.  Left lower extremity venography demonstrated partially occlusive thrombus in the popliteal vein above the knee extending partially through   the femoral vein .  The femoral vein is duplicated, the smaller moiety been somewhat irregular.  Near the confluence of the two moieties there is additional   acute and chronic thrombus which extends more extensively through the common femoral vein, the thrombus in this segment nearly occlusive. Little iliac venous clot.  There is significant tapered narrowing of the left iliac vein near its confluence with the IVC.  There is   partially occlusive thrombus in the IVC just above the iliac confluence. The suprarenal IVC is widely patent.  In similar fashion, the right popliteal vein was accessed with a 21- gauge micropuncture needle, exchanged over a 018 guide wire for a transitional dilator for right lower extremity venography.  Right lower extremity venography demonstrates scattered nonocclusive thrombus in the superficial femoral vein and common femoral vein.  Minimal thrombus in the iliac venous system. Despite the fairly extensive infrarenal caval thrombus, there is good flow into the more central IVC.  Both catheters were then exchanged for 6-French vascular sheaths, which allowed placement of infusion catheters, using a 90 cm catheter on the left with 50 cm infusion length, and a 90/ 30 catheter on the right.  Catheter directed venous thrombolysis with tenecteplase was then initiated and the patient   admitted to the ICU for overnight infusion. The patient  tolerated the procedure well.  No immediate complication.  IMPRESSION:  1.  Large amount of infrarenal IVC partially occlusive thrombus. 2.  Partially occlusive femoropopliteal DVT, worse left than right. 3.  Initiation of bilateral lower extremity catheter directed venous thrombolysis.  Plans for follow-up venography on 04/05/2012.   Original Report Authenticated By: Osa Craver, M.D.    Ir US Guide Vasc Access Left  04/05/2012  *RADIOLOGY REPORT*  Clinical Data: Symptomatic bilateral lower extremity DVT and caval thrombus.  BILATERAL LOWER EXTREMITY VENOGRAPHY ULTRASOUND GUIDED VASCULAR ACCESS X2 INITIATION OF CATHETER DIRECTED THROMBOLYSIS  Comparison: MR 03/28/2012 and earlier studies  Technique and findings: The procedure, risks (including but not limited to bleeding, infection, organ damage), benefits, and alternatives were explained to the patient.  Questions regarding the procedure were encouraged and answered.  The patient understands and consents to the procedure.The posterior popliteal regions were prepped and draped usual sterile fashion. Maximal barrier sterile technique was utilized including caps, mask, sterile gowns, sterile gloves, sterile drape, hand hygiene and skin antiseptic.  Intravenous Fentanyl and Versed were administered as conscious sedation during continuous cardiorespiratory monitoring by the radiology RN, with a total moderate sedation time of 40 minutes.  Under real time ultrasound guidance, the left popliteal vein was accessed with a 21-gauge micropuncture  needle, exchanged over a 018 guide wire for a transitional dilator for left lower extremity venography. The dilator was exchanged for 5-French Kumpe catheter for additional left iliocaval venography.  Left lower extremity venography demonstrated partially occlusive thrombus in the popliteal vein above the knee extending partially through   the femoral vein .  The femoral vein is duplicated, the smaller moiety been  somewhat irregular.  Near the confluence of the two moieties there is additional   acute and chronic thrombus which extends more extensively through the common femoral vein, the thrombus in this segment nearly occlusive. Little iliac venous clot.  There is significant tapered narrowing of the left iliac vein near its confluence with the IVC.  There is   partially occlusive thrombus in the IVC just above the iliac confluence. The suprarenal IVC is widely patent.  In similar fashion, the right popliteal vein was accessed with a 21- gauge micropuncture needle, exchanged over a 018 guide wire for a transitional dilator for right lower extremity venography.  Right lower extremity venography demonstrates scattered nonocclusive thrombus in the superficial femoral vein and common femoral vein.  Minimal thrombus in the iliac venous system. Despite the fairly extensive infrarenal caval thrombus, there is good flow into the more central IVC.  Both catheters were then exchanged for 6-French vascular sheaths, which allowed placement of infusion catheters, using a 90 cm catheter on the left with 50 cm infusion length, and a 90/ 30 catheter on the right.  Catheter directed venous thrombolysis with tenecteplase was then initiated and the patient   admitted to the ICU for overnight infusion. The patient tolerated the procedure well.  No immediate complication.  IMPRESSION:  1.  Large amount of infrarenal IVC partially occlusive thrombus. 2.  Partially occlusive femoropopliteal DVT, worse left than right. 3.  Initiation of bilateral lower extremity catheter directed venous thrombolysis.  Plans for follow-up venography on 04/05/2012.   Original Report Authenticated By: Osa Craver, M.D.    Ir US Guide Vasc Access Right  04/05/2012  *RADIOLOGY REPORT*  Clinical Data: Symptomatic bilateral lower extremity DVT and caval thrombus.  BILATERAL LOWER EXTREMITY VENOGRAPHY ULTRASOUND GUIDED VASCULAR ACCESS X2 INITIATION OF  CATHETER DIRECTED THROMBOLYSIS  Comparison: MR 03/28/2012 and earlier studies  Technique and findings: The procedure, risks (including but not limited to bleeding, infection, organ damage), benefits, and alternatives were explained to the patient.  Questions regarding the procedure were encouraged and answered.  The patient understands and consents to the procedure.The posterior popliteal regions were prepped and draped usual sterile fashion. Maximal barrier sterile technique was utilized including caps, mask, sterile gowns, sterile gloves, sterile drape, hand hygiene and skin antiseptic.  Intravenous Fentanyl and Versed were administered as conscious sedation during continuous cardiorespiratory monitoring by the radiology RN, with a total moderate sedation time of 40 minutes.  Under real time ultrasound guidance, the left popliteal vein was accessed with a 21-gauge micropuncture needle, exchanged over a 018 guide wire for a transitional dilator for left lower extremity venography. The dilator was exchanged for 5-French Kumpe catheter for additional left iliocaval venography.  Left lower extremity venography demonstrated partially occlusive thrombus in the popliteal vein above the knee extending partially through   the femoral vein .  The femoral vein is duplicated, the smaller moiety been somewhat irregular.  Near the confluence of the two moieties there is additional   acute and chronic thrombus which extends more extensively through the common femoral vein, the thrombus in this segment nearly occlusive. Little  iliac venous clot.  There is significant tapered narrowing of the left iliac vein near its confluence with the IVC.  There is   partially occlusive thrombus in the IVC just above the iliac confluence. The suprarenal IVC is widely patent.  In similar fashion, the right popliteal vein was accessed with a 21- gauge micropuncture needle, exchanged over a 018 guide wire for a transitional dilator for right lower  extremity venography.  Right lower extremity venography demonstrates scattered nonocclusive thrombus in the superficial femoral vein and common femoral vein.  Minimal thrombus in the iliac venous system. Despite the fairly extensive infrarenal caval thrombus, there is good flow into the more central IVC.  Both catheters were then exchanged for 6-French vascular sheaths, which allowed placement of infusion catheters, using a 90 cm catheter on the left with 50 cm infusion length, and a 90/ 30 catheter on the right.  Catheter directed venous thrombolysis with tenecteplase was then initiated and the patient   admitted to the ICU for overnight infusion. The patient tolerated the procedure well.  No immediate complication.  IMPRESSION:  1.  Large amount of infrarenal IVC partially occlusive thrombus. 2.  Partially occlusive femoropopliteal DVT, worse left than right. 3.  Initiation of bilateral lower extremity catheter directed venous thrombolysis.  Plans for follow-up venography on 04/05/2012.   Original Report Authenticated By: Osa Craver, M.D.    Ir Infusion Thrombol Arterial Initial (ms)  04/05/2012  *RADIOLOGY REPORT*  Clinical Data: Symptomatic bilateral lower extremity DVT and caval thrombus.  BILATERAL LOWER EXTREMITY VENOGRAPHY ULTRASOUND GUIDED VASCULAR ACCESS X2 INITIATION OF CATHETER DIRECTED THROMBOLYSIS  Comparison: MR 03/28/2012 and earlier studies  Technique and findings: The procedure, risks (including but not limited to bleeding, infection, organ damage), benefits, and alternatives were explained to the patient.  Questions regarding the procedure were encouraged and answered.  The patient understands and consents to the procedure.The posterior popliteal regions were prepped and draped usual sterile fashion. Maximal barrier sterile technique was utilized including caps, mask, sterile gowns, sterile gloves, sterile drape, hand hygiene and skin antiseptic.  Intravenous Fentanyl and Versed  were administered as conscious sedation during continuous cardiorespiratory monitoring by the radiology RN, with a total moderate sedation time of 40 minutes.  Under real time ultrasound guidance, the left popliteal vein was accessed with a 21-gauge micropuncture needle, exchanged over a 018 guide wire for a transitional dilator for left lower extremity venography. The dilator was exchanged for 5-French Kumpe catheter for additional left iliocaval venography.  Left lower extremity venography demonstrated partially occlusive thrombus in the popliteal vein above the knee extending partially through   the femoral vein .  The femoral vein is duplicated, the smaller moiety been somewhat irregular.  Near the confluence of the two moieties there is additional   acute and chronic thrombus which extends more extensively through the common femoral vein, the thrombus in this segment nearly occlusive. Little iliac venous clot.  There is significant tapered narrowing of the left iliac vein near its confluence with the IVC.  There is   partially occlusive thrombus in the IVC just above the iliac confluence. The suprarenal IVC is widely patent.  In similar fashion, the right popliteal vein was accessed with a 21- gauge micropuncture needle, exchanged over a 018 guide wire for a transitional dilator for right lower extremity venography.  Right lower extremity venography demonstrates scattered nonocclusive thrombus in the superficial femoral vein and common femoral vein.  Minimal thrombus in the iliac venous system. Despite  the fairly extensive infrarenal caval thrombus, there is good flow into the more central IVC.  Both catheters were then exchanged for 6-French vascular sheaths, which allowed placement of infusion catheters, using a 90 cm catheter on the left with 50 cm infusion length, and a 90/ 30 catheter on the right.  Catheter directed venous thrombolysis with tenecteplase was then initiated and the patient   admitted to the  ICU for overnight infusion. The patient tolerated the procedure well.  No immediate complication.  IMPRESSION:  1.  Large amount of infrarenal IVC partially occlusive thrombus. 2.  Partially occlusive femoropopliteal DVT, worse left than right. 3.  Initiation of bilateral lower extremity catheter directed venous thrombolysis.  Plans for follow-up venography on 04/05/2012.   Original Report Authenticated By: Thora Lance III, M.D.      ASSESSMENT and PLAN:   Patient with extensive thrombosis and lupus anticoagulant positive admitted for thrombolytic therapy with possibly pta/stent which he appears to be tolerating well.  He continues with gtt heparin.  If he remains stable and at time of discharge I have asked pharmacy to transition him to Xarelto.  Arlan Organ I., MD 04/05/2012

## 2012-04-05 NOTE — Care Management Note (Addendum)
    Page 1 of 1   04/08/2012     3:25:15 PM   CARE MANAGEMENT NOTE 04/08/2012  Patient:  Andrew Clayton, Andrew Clayton   Account Number:  [id number]  Date Initiated:  04/05/2012  Documentation initiated by:  Junius Creamer  Subjective/Objective Assessment:   adm w dvt  patient lives with spouse, pta independent.     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   (If response is "NO", the following Medicare IM given date fields will be blank) Date Medicare IM given:   Date Additional Medicare IM given:    Discharge Disposition:  HOME/SELF CARE  Per UR Regulation:  Reviewed for med. necessity/level of care/duration of stay  If discussed at Long Length of Stay Meetings, dates discussed:    Comments:  03/1412 15:24 Letha Cape RN, BSN [phone] patient lives with spouse, pta indepedent. patient is on xarelto so will not need pt/inr checked.  Patient has medication coverage and transportation.  10/11 8:47 a debbie dowell rn,bsn [id number]

## 2012-04-05 NOTE — Progress Notes (Signed)
ANTICOAGULATION CONSULT NOTE - Follow Up Consult  Pharmacy Consult for heparin Indication: s/p thrombolysis and concurrent TNKase  Labs:  Basename 04/05/12 04/04/12 1858 04/04/12 1334 04/02/12 1422  HGB -- 12.5* 14.5 --  HCT -- 37.6* 42.7 42.4  PLT -- 344 400 340  APTT -- -- 48* --  LABPROT -- -- 14.1 --  INR -- -- 1.10 1.80*  HEPARINUNFRC 0.15* -- -- --  CREATININE -- -- 1.14 1.1  CKTOTAL -- -- -- --  CKMB -- -- -- --  TROPONINI -- -- -- --    Assessment: 41yo male subtherapeutic on heparin with initial dosing s/p thrombolysis with concurrent TNKase; had large clot burden and +hypercoag studies.  Goal of Therapy:  Heparin level 0.2-0.5 units/ml   Plan:  Will increase heparin gtt by 2 units/kg/hr to 1000 units/hr and continue monitoring levels Q6hr.  Colleen Can PharmD BCPS 04/05/2012,1:24 AM

## 2012-04-06 ENCOUNTER — Encounter (HOSPITAL_COMMUNITY): Payer: Self-pay

## 2012-04-06 ENCOUNTER — Observation Stay (HOSPITAL_COMMUNITY): Payer: BC Managed Care – PPO

## 2012-04-06 LAB — FIBRINOGEN
Fibrinogen: 418 mg/dL (ref 204–475)
Fibrinogen: 425 mg/dL (ref 204–475)

## 2012-04-06 LAB — CBC
HCT: 38 % — ABNORMAL LOW (ref 39.0–52.0)
MCV: 84.8 fL (ref 78.0–100.0)
RBC: 4.48 MIL/uL (ref 4.22–5.81)
RDW: 15.4 % (ref 11.5–15.5)
WBC: 11.1 10*3/uL — ABNORMAL HIGH (ref 4.0–10.5)

## 2012-04-06 LAB — HEPARIN LEVEL (UNFRACTIONATED): Heparin Unfractionated: 0.25 IU/mL — ABNORMAL LOW (ref 0.30–0.70)

## 2012-04-06 MED ORDER — HEPARIN (PORCINE) IN NACL 100-0.45 UNIT/ML-% IJ SOLN
2000.0000 [IU]/h | INTRAMUSCULAR | Status: DC
  Administered 2012-04-06 (×3): 2000 [IU]/h via INTRAVENOUS
  Filled 2012-04-06 (×3): qty 250

## 2012-04-06 MED ORDER — FENTANYL CITRATE 0.05 MG/ML IJ SOLN
INTRAMUSCULAR | Status: AC
  Filled 2012-04-06: qty 6

## 2012-04-06 MED ORDER — HEPARIN (PORCINE) IN NACL 100-0.45 UNIT/ML-% IJ SOLN
2400.0000 [IU]/h | INTRAMUSCULAR | Status: DC
  Administered 2012-04-06: 2400 [IU]/h via INTRAVENOUS
  Filled 2012-04-06 (×2): qty 250

## 2012-04-06 MED ORDER — MIDAZOLAM HCL 2 MG/2ML IJ SOLN
INTRAMUSCULAR | Status: AC
  Filled 2012-04-06: qty 6

## 2012-04-06 MED ORDER — MIDAZOLAM HCL 2 MG/2ML IJ SOLN
INTRAMUSCULAR | Status: AC | PRN
  Administered 2012-04-06: 1 mg via INTRAVENOUS
  Administered 2012-04-06 (×2): 0.5 mg via INTRAVENOUS

## 2012-04-06 MED ORDER — IOHEXOL 300 MG/ML  SOLN
150.0000 mL | Freq: Once | INTRAMUSCULAR | Status: AC | PRN
  Administered 2012-04-06: 60 mL via INTRAVENOUS

## 2012-04-06 MED ORDER — FENTANYL CITRATE 0.05 MG/ML IJ SOLN
INTRAMUSCULAR | Status: AC | PRN
  Administered 2012-04-06: 50 ug via INTRAVENOUS

## 2012-04-06 NOTE — Procedures (Signed)
Proc - 40 hr BLE Venous Lysis Check. Thrombectomy via Angiojet.  Find - Improved thrombus burden B. After Angiojet, there is residual thrombus at R groin, L groin, and L CIV. May-Thurmer Syndrome a possibility.  Comp - None.  Plan - [place] overnight. Possibly L CIV stent tomorrow.

## 2012-04-06 NOTE — Progress Notes (Signed)
ANTICOAGULATION CONSULT NOTE - Follow Up Consult  LATE ENTRY  Pharmacy Consult for heparin  Indication: s/p thrombolysis and concurrent TNKase  Labs:  Basename 04/06/12 0800 04/06/12 0645 04/06/12 0058 04/05/12 1158 04/05/12 0600 04/04/12 1858 04/04/12 1334  HGB -- 12.7* -- -- 12.1* -- --  HCT -- 38.0* -- -- 36.3* 37.6* --  PLT -- 219 -- -- 324 344 --  APTT -- -- -- -- -- -- 48*  LABPROT -- -- -- -- -- -- 14.1  INR -- -- -- -- -- -- 1.10  HEPARINUNFRC <0.10* -- <0.10* <0.10* -- -- --  CREATININE -- -- -- -- -- -- 1.14  CKTOTAL -- -- -- -- -- -- --  CKMB -- -- -- -- -- -- --  TROPONINI -- -- -- -- -- -- --    Assessment: 41yo male on TNKase and heparin and heparin level remains undetectable. Patient noted at goal previously at 2900 units/hr but without TNK. RN noted tea colored urine and per MD this represents hemoglobinuria, not hematuria.  Goal of Therapy:  Heparin level 0.2-0.5 units/ml   Plan:  -Increase heparin to 2400 units/hr -Heparin level in 6hrs  Harland German, [place] D 04/06/2012 9:39 PM

## 2012-04-06 NOTE — Progress Notes (Signed)
ANTICOAGULATION CONSULT NOTE - Follow Up Consult  Pharmacy Consult for heparin Indication: s/p thrombolysis and concurrent TNKase  Labs:  Basename 04/06/12 0058 04/05/12 1158 04/05/12 0600 04/04/12 1858 04/04/12 1334  HGB -- -- 12.1* 12.5* --  HCT -- -- 36.3* 37.6* 42.7  PLT -- -- 324 344 400  APTT -- -- -- -- 48*  LABPROT -- -- -- -- 14.1  INR -- -- -- -- 1.10  HEPARINUNFRC <0.10* <0.10* 0.14* -- --  CREATININE -- -- -- -- 1.14  CKTOTAL -- -- -- -- --  CKMB -- -- -- -- --  TROPONINI -- -- -- -- --    Assessment: 41yo male remains undetectable on heparin after rate increases; of note pt was previously therapeutic at 2900 units/hr; fibrinogen continues to decrease.  Goal of Therapy:  Heparin level 0.2-0.5 units/ml   Plan:  Will increase heparin gtt fairly aggressively to 2000 units/hr given previously therapeutic rate and check level in 6hr.  Colleen Can PharmD BCPS 04/06/2012,1:45 AM

## 2012-04-06 NOTE — Progress Notes (Signed)
Upon return from Interventional Radiology, it was noted that patient's urine had changed from clear straw colored urine to tea colored. With both the heparin and the TNK infusing I was concerned about bleeding. Dr. Orest Dikes was notified. He felt that this was due to the Angiojet device used today in interventional radiology. He said that the red cells rupture during the procedure, and the contents of the red cells are flushed out in the urine. This is hemoglobinuria, not hematuria. He is aware and I also notified pharmacy of the change in color of the urine. We are checking cbc's in the am.

## 2012-04-06 NOTE — Progress Notes (Signed)
Upon rounding patient TNK going at 0.3 in left leg  And 0.1in right leg. Orders written for 0.2 simultaneously in each leg. Unable to reach interventional radiologist MD Orest Dikes for clarification. Paged twice without success. No bleeding noted. Pulses equal bilaterally,  Cap refill less than 3. Will continue at current rate since day shift RN reported the changes were made in IR much earlier today. Hyman Hopes, RN

## 2012-04-06 NOTE — Progress Notes (Signed)
ANTICOAGULATION CONSULT NOTE - Follow Up Consult  Pharmacy Consult for heparin Indication: s/p thrombolysis and concurrent TNKase  Labs:  Basename 04/06/12 0800 04/06/12 0645 04/06/12 0058 04/05/12 1158 04/05/12 0600 04/04/12 1858 04/04/12 1334  HGB -- 12.7* -- -- 12.1* -- --  HCT -- 38.0* -- -- 36.3* 37.6* --  PLT -- 219 -- -- 324 344 --  APTT -- -- -- -- -- -- 48*  LABPROT -- -- -- -- -- -- 14.1  INR -- -- -- -- -- -- 1.10  HEPARINUNFRC <0.10* -- <0.10* <0.10* -- -- --  CREATININE -- -- -- -- -- -- 1.14  CKTOTAL -- -- -- -- -- -- --  CKMB -- -- -- -- -- -- --  TROPONINI -- -- -- -- -- -- --    Assessment: 41yo male on TNKase and heparin and heparin levl now at goal on 2400 units/hr (heparin level= 0.25 @ 1858 per discussion with lab; reporting system to [employer] is down)  Goal of Therapy:  Heparin level 0.2-0.5 units/ml   Plan:  -No heparin changes needed -Will recheck a heparin level in am  Harland German, Pharm D 04/06/2012 9:39 PM

## 2012-04-07 ENCOUNTER — Observation Stay (HOSPITAL_COMMUNITY): Payer: BC Managed Care – PPO

## 2012-04-07 LAB — CBC
MCHC: 32.9 g/dL (ref 30.0–36.0)
RDW: 15.2 % (ref 11.5–15.5)

## 2012-04-07 LAB — HEPARIN LEVEL (UNFRACTIONATED): Heparin Unfractionated: <0.1 IU/mL — ABNORMAL LOW (ref 0.30–0.70)

## 2012-04-07 LAB — FIBRINOGEN
Fibrinogen: 432 mg/dL (ref 204–475)
Fibrinogen: 472 mg/dL (ref 204–475)

## 2012-04-07 MED ORDER — RIVAROXABAN 15 MG PO TABS
15.0000 mg | ORAL_TABLET | Freq: Two times a day (BID) | ORAL | Status: DC
  Administered 2012-04-07: 15 mg via ORAL
  Filled 2012-04-07 (×2): qty 1

## 2012-04-07 MED ORDER — IOHEXOL 300 MG/ML  SOLN
150.0000 mL | Freq: Once | INTRAMUSCULAR | Status: AC | PRN
  Administered 2012-04-07: 120 mL via INTRAVENOUS

## 2012-04-07 MED ORDER — MORPHINE SULFATE 2 MG/ML IJ SOLN
2.0000 mg | INTRAMUSCULAR | Status: DC | PRN
Start: 2012-04-07 — End: 2012-04-08
  Administered 2012-04-07 – 2012-04-08 (×3): 2 mg via INTRAVENOUS
  Filled 2012-04-07 (×3): qty 1

## 2012-04-07 MED ORDER — MIDAZOLAM HCL 2 MG/2ML IJ SOLN
INTRAMUSCULAR | Status: AC | PRN
  Administered 2012-04-07: 1 mg via INTRAVENOUS
  Administered 2012-04-07: 0.5 mg via INTRAVENOUS
  Administered 2012-04-07: 1 mg via INTRAVENOUS
  Administered 2012-04-07: 0.5 mg via INTRAVENOUS

## 2012-04-07 MED ORDER — FENTANYL CITRATE 0.05 MG/ML IJ SOLN
INTRAMUSCULAR | Status: AC | PRN
  Administered 2012-04-07 (×4): 25 ug via INTRAVENOUS

## 2012-04-07 MED ORDER — RIVAROXABAN 15 MG PO TABS
15.0000 mg | ORAL_TABLET | Freq: Two times a day (BID) | ORAL | Status: DC
  Administered 2012-04-08 (×2): 15 mg via ORAL
  Filled 2012-04-07 (×3): qty 1

## 2012-04-07 MED ORDER — HEPARIN (PORCINE) IN NACL 100-0.45 UNIT/ML-% IJ SOLN
2650.0000 [IU]/h | INTRAMUSCULAR | Status: DC
  Filled 2012-04-07 (×2): qty 250

## 2012-04-07 NOTE — Progress Notes (Signed)
Patient right calf measuring 17.25 inches.  Left calf 15.75 inches.  Will continue to monitor patient.

## 2012-04-07 NOTE — Procedures (Signed)
Proc - Venous lysis check, Venous PTA  Find - Improvement. Near complete resolution in RLE and R iliac venous system. Residual chronic thrombus in L CFV, LCIV, and IVC.  Int - Venous PTA 10mm LCFV. Venous PTA LCIV and IVC to 12mm and 124 mm. With adequate result. Some residual narrowing in IVC and LCIV  Comp - None

## 2012-04-07 NOTE — Progress Notes (Signed)
ANTICOAGULATION CONSULT NOTE - Follow Up Consult  Pharmacy Consult for heparin Indication: s/p thrombolysis and concurrent TNKase  Labs:  Basename 04/07/12 0658 04/06/12 1858 04/06/12 0800 04/06/12 0645 04/05/12 0600 04/04/12 1334  HGB 11.3* -- -- 12.7* -- --  HCT 34.3* -- -- 38.0* 36.3* --  PLT 161 -- -- 219 324 --  APTT -- -- -- -- -- 48*  LABPROT -- -- -- -- -- 14.1  INR -- -- -- -- -- 1.10  HEPARINUNFRC <0.10* 0.25* <0.10* -- -- --  CREATININE -- -- -- -- -- 1.14  CKTOTAL -- -- -- -- -- --  CKMB -- -- -- -- -- --  TROPONINI -- -- -- -- -- --    Assessment: 41yo male on TNKase and heparin. Heparin level subtherapeutic.  No problems with line or interruptions in therapy overnight per RN.  Patient required 2900 units/hr earlier to this month (10/3) to be at goal range.  No bleeding reported- urine now clear.    Goal of Therapy:  Heparin level 0.2-0.5 units/ml   Plan:  Increase heparin to 2650 units/hr=26.77ml/hr. Heparin level in 6 hours. Daily heparin level/CBC.  Wendie Simmer, PharmD, BCPS Clinical Pharmacist  Pager: [phone]

## 2012-04-07 NOTE — Progress Notes (Signed)
ANTICOAGULATION CONSULT NOTE - Follow Up Consult  Pharmacy Consult for heparin Indication: s/p thrombolysis  Labs:  Basename 04/07/12 0658 04/06/12 1858 04/06/12 0800 04/06/12 0645 04/05/12 0600 04/04/12 1334  HGB 11.3* -- -- 12.7* -- --  HCT 34.3* -- -- 38.0* 36.3* --  PLT 161 -- -- 219 324 --  APTT -- -- -- -- -- 48*  LABPROT -- -- -- -- -- 14.1  INR -- -- -- -- -- 1.10  HEPARINUNFRC <0.10* 0.25* <0.10* -- -- --  CREATININE -- -- -- -- -- 1.14  CKTOTAL -- -- -- -- -- --  CKMB -- -- -- -- -- --  TROPONINI -- -- -- -- -- --    Assessment: 41yo male with DVT on heparin. TNKase was discontinued this am s/p venous lysis check.  Per MD plans are for Xarelto 15mg  po bid with heparin for 12 hrs due to thrombus burden. Heparin to restart at noon today at 2650 units/hr.   Goal of Therapy:  Heparin level 0.2-0.5 units/ml   Plan:  -Continue heparin for 12 hours with Xarelto per MD plan (d/c heparin at midnight) -No further heparin levels (or aPTT monitoring) as heparin to d/c tonight.  Harland German, Pharm D 04/07/2012 11:45 AM

## 2012-04-07 NOTE — Progress Notes (Signed)
Andrew Clayton is a 41 y.o. male patient who transferred  from 2100 awake, alert  & orientated  X 3, Prior, VSS - Blood pressure 131/72, pulse 84, temperature 98.3 F (36.8 C), temperature source Oral, resp. rate 18, height 6\' 1"  (1.854 m), weight 97.7 kg (215 lb 6.2 oz), SpO2 96.00%., no c/o shortness of breath, no c/o chest pain, no distress noted.   IV site WDL: 2 sites on forearm left, condition patent and no redness with a transparent dsg that's clean dry and intact.  Allergies:  No Known Allergies   Past Medical History  Diagnosis Date  . ADHD (attention deficit hyperactivity disorder)     Pt orientation to unit, room and routine. SR up x 2, fall risk assessment complete with Patient and family verbalizing understanding of risks associated with falls. Pt verbalizes an understanding of how to use the call bell and to call for help before getting out of bed.  Skin, clean-dry- intact without evidence of bruising, or skin tears.   No evidence of skin break down noted on exam. Pt has edema in right and left calves. Right calf is 16.75 ", left calf is 15.5 ". Pt calves to be measured and report to MD if increasing girth or pain level.     Will cont to monitor and assist as needed.  Cindra Eves, RN 04/07/2012 6:11 PM

## 2012-04-07 NOTE — Progress Notes (Signed)
Pt. Returned from Interventional Radiology ( IR). During the procedure the RN reported that the patient was given 3 mg versed and 100 of fentanyl. All TNKase has been stopped this am after lysis check in IR. The heparin gtt was stopped by Dr. Bonnielee Haff from [id number] per his order. Heparin gtt was resumed at 1130 at 26.5 cc/hr after speaking with the [facility] from Pharmacy. Xarelto 15 mg was given at 1248. The heparin gtt is supposed to be stopped 12 hours after the first dose of Xarelto. The patient is alert, oriented x3 and aware of the plan for his care. His wife is at the bedside.

## 2012-04-07 NOTE — Progress Notes (Signed)
Attempted 3 times to call Dr. Bonnielee Haff about Pt right calf increase in girth to 17.25 and increase in pain per his request. MD has not called back at this time. Also called acute care MD about decreasing morphine dose. MD said order to be put in. Waiting on order. Peter Congo RN

## 2012-04-07 NOTE — Progress Notes (Signed)
Dr. Bonnielee Haff called regarding the increase in calf size of the right calf. This is the side that the hematoma was noted to be present on after pulling the sheath. The hematoma behind the right knee is unchanged in size and has been marked. The measurement of the right calf is 18 inches and the left calf is 17 inches. The patient also stated that the right calf hurts a bit. Dr. Bonnielee Haff wants [place] to measure the calf again tonight. He asked me to stop the heparin since the xarelto is adequate for anticoagulation for the PE. A cbc is ordered for the am. The nurse on 5500 was notified of all of these changes. She knows to keep him on bedrest for 3 more hours...until 7pm, and then ambulate. She also knows to call for increase in calf size or pain.

## 2012-04-08 ENCOUNTER — Other Ambulatory Visit: Payer: Self-pay | Admitting: Emergency Medicine

## 2012-04-08 LAB — GLUCOSE, CAPILLARY
Glucose-Capillary: 109 mg/dL — ABNORMAL HIGH (ref 70–99)
Glucose-Capillary: 123 mg/dL — ABNORMAL HIGH (ref 70–99)

## 2012-04-08 LAB — CBC
MCH: 27.9 pg (ref 26.0–34.0)
MCV: 85.4 fL (ref 78.0–100.0)
Platelets: 158 10*3/uL (ref 150–400)
RBC: 4.19 MIL/uL — ABNORMAL LOW (ref 4.22–5.81)

## 2012-04-08 NOTE — Progress Notes (Signed)
04/08/12 Patient to be discharged today home. IV site removed and discharge instructions reviewed with patient.

## 2012-04-08 NOTE — Discharge Summary (Signed)
Physician Discharge Summary  Patient ID: Andrew Clayton MRN: [id number] DOB/AGE: 1970-07-04 41 y.o.  Admit date: 04/04/2012 Discharge date: 04/08/2012  Admission Diagnoses: Principal Problem:  *DVT (deep venous thrombosis) Active Problems:  PE (pulmonary embolism)  Discharge Diagnoses:  Principal Problem:  *DVT (deep venous thrombosis) Active Problems:  PE (pulmonary embolism)    Procedures: LE and IVC thrombolysis with PTA of (L)common iliac and distal IVC  Discharged Condition: good  Hospital Course: Admitted 03/25/12 for Bilat DVT and subsequent pulmonary embolus  Hospitalized on Hep x 1 week and discharged on Xeralto  Pain has decreased but remains mostly in Rt thigh  Dr Dalene Carrow has requested evaluation and possible treatment  + antiphospholipid antibody  Scheduled now for Bilat lower extremity venogram with probable transcatheter thrombolysis and possible mechanical thrombectomy with possible angioplasty/stent placement  Coincidental finding of rt lung nodule during hospitalization  HPI: ADHD; B PE; B low extr dvt; antiphospholipid antibody  Now readmitted on 10/10 for (B)LE venogram with intention of thrombolysis. Treatment initiated on 10/10 with multiple subsequent venograms and catheter repositioning. Final intervention on 10/13 with near complete resolution of Rt LE and iliac system, and residual chronic thrombus in (L)CFV< LCIV and distal IVC after PTA. The pt has done well throughout treatment. No additional c/o He did develop a small hematoma in the right pop fossa. His swelling is improved and he is restarted on Xarelto with good toleration. He has been examined by the attending IR MD and we feel he is safe for discharge today We have reviewed his discharge instructions and med list. He will need f/u appt with Dr. Bonnielee Haff and LE Venous duplex in 3-4 weeks. He will stay on his Xarelto as directed. He has a scheduled PET scan but will receive further instructions from Dr.  Dalene Carrow  Consults: hematology/oncology   Discharge Exam: Blood pressure 106/65, pulse 77, temperature 98.6 F (37 C), temperature source Oral, resp. rate 18, height 6\' 1"  (1.854 m), weight 215 lb 6.2 oz (97.7 kg), SpO2 96.00%.   Disposition: 01-Home or Self Care      Discharge Orders    Future Appointments: Provider: Department: Dept Phone: Center:   04/10/2012 10:30 AM [facility] Pet 1 [facility] [phone] [facility] LONG   06/28/2012 4:00 PM Sherrie Mustache [facility] [phone] None   06/28/2012 5:30 PM [facility]Ct 2 [facility] Imaging [phone] [place]   07/03/2012 11:30 AM Laurice Record, MD [facility] [phone] None     Future Orders Please Complete By Expires   Diet - low sodium heart healthy      Diet general      Increase activity slowly      May shower / Bathe      May walk up steps      Other Restrictions      Comments:   No strenuous activity more than walking. Stairs ok. When up and about, please wear compression stockings. Can remove stockings and keep legs elevated when resting.   Remove dressing in 24 hours      Call MD for:  redness, tenderness, or signs of infection (pain, swelling, redness, odor or green/yellow discharge around incision site)      Call MD for:  severe uncontrolled pain      Call MD for:  temperature >100.4      Call MD for:  difficulty breathing, headache or visual disturbances          Medication List     As of 04/08/2012  2:14  PM    STOP taking these medications         enoxaparin 150 MG/ML injection   Commonly known as: LOVENOX      TAKE these medications         acetaminophen 500 MG tablet   Commonly known as: TYLENOL   Take 1,000 mg by mouth every 6 (six) hours as needed. For pain      amphetamine-dextroamphetamine 25 MG 24 hr capsule   Commonly known as: ADDERALL XR   Take 50 mg by mouth every morning.      clonazePAM 0.5 MG tablet   Commonly known as: KLONOPIN   Take 0.5 mg by mouth at bedtime as  needed. For stress      multivitamin tablet   Take 1 tablet by mouth daily.      Rivaroxaban 15 MG Tabs tablet   Commonly known as: XARELTO   Take 1 tablet (15 mg total) by mouth 2 (two) times daily with a meal.      Rivaroxaban 20 MG Tabs   Commonly known as: XARELTO   Take 1 tablet (20 mg total) by mouth daily with breakfast.        Follow-up Information    Follow up with HOSS,ART A, MD. In 4 weeks. (Office will call.)    Contact information:   [facility] [phone]       Follow up with ODOGWU,LAURETTA I., MD. (Expect a call from her office)    Contact information:   [address] [place] [address] [phone]          Signed: Brayton El PA-C 04/08/2012, 2:14 PM

## 2012-04-08 NOTE — Progress Notes (Addendum)
Patient  Measure ment to right leg is 17 1/4 leg diameter and 16 inches left leg.

## 2012-04-09 ENCOUNTER — Other Ambulatory Visit: Payer: Self-pay | Admitting: *Deleted

## 2012-04-09 ENCOUNTER — Telehealth: Payer: Self-pay | Admitting: Hematology and Oncology

## 2012-04-09 NOTE — Telephone Encounter (Signed)
lmonvm adviising the pt of his md appt with dr odogwu on 04/11/2012@11 :30am

## 2012-04-09 NOTE — Telephone Encounter (Signed)
lmonvm of the pt adviisng him that the appt on 04/11/2012 has been cancelled and just come in for the pet scan appt as scheduled/.

## 2012-04-10 ENCOUNTER — Encounter (HOSPITAL_COMMUNITY)
Admission: RE | Admit: 2012-04-10 | Discharge: 2012-04-10 | Disposition: A | Discharge status: Home / Self Care | Payer: BC Managed Care – PPO | Source: Ambulatory Visit | Attending: Hematology and Oncology | Admitting: Hematology and Oncology

## 2012-04-10 ENCOUNTER — Encounter (HOSPITAL_COMMUNITY): Payer: Self-pay

## 2012-04-10 DIAGNOSIS — Z86718 Personal history of other venous thrombosis and embolism: Secondary | ICD-10-CM | POA: Insufficient documentation

## 2012-04-10 DIAGNOSIS — Z86711 Personal history of pulmonary embolism: Secondary | ICD-10-CM | POA: Insufficient documentation

## 2012-04-10 DIAGNOSIS — R609 Edema, unspecified: Secondary | ICD-10-CM | POA: Insufficient documentation

## 2012-04-10 DIAGNOSIS — O2230 Deep phlebothrombosis in pregnancy, unspecified trimester: Secondary | ICD-10-CM

## 2012-04-10 DIAGNOSIS — R161 Splenomegaly, not elsewhere classified: Secondary | ICD-10-CM | POA: Insufficient documentation

## 2012-04-10 DIAGNOSIS — R911 Solitary pulmonary nodule: Secondary | ICD-10-CM | POA: Insufficient documentation

## 2012-04-10 DIAGNOSIS — I2699 Other pulmonary embolism without acute cor pulmonale: Secondary | ICD-10-CM

## 2012-04-10 MED ORDER — FLUDEOXYGLUCOSE F - 18 (FDG) INJECTION
16.3000 | Freq: Once | INTRAVENOUS | Status: AC | PRN
  Administered 2012-04-10: 16.3 via INTRAVENOUS

## 2012-04-11 ENCOUNTER — Encounter: Payer: Self-pay | Admitting: Hematology and Oncology

## 2012-04-11 ENCOUNTER — Other Ambulatory Visit: Payer: Self-pay | Admitting: *Deleted

## 2012-04-11 ENCOUNTER — Ambulatory Visit (HOSPITAL_BASED_OUTPATIENT_CLINIC_OR_DEPARTMENT_OTHER): Payer: BC Managed Care – PPO | Admitting: Lab

## 2012-04-11 ENCOUNTER — Telehealth: Payer: Self-pay | Admitting: Hematology and Oncology

## 2012-04-11 ENCOUNTER — Telehealth: Payer: Self-pay | Admitting: *Deleted

## 2012-04-11 ENCOUNTER — Ambulatory Visit (HOSPITAL_BASED_OUTPATIENT_CLINIC_OR_DEPARTMENT_OTHER): Payer: BC Managed Care – PPO | Admitting: Hematology and Oncology

## 2012-04-11 ENCOUNTER — Ambulatory Visit: Payer: BC Managed Care – PPO | Admitting: Hematology and Oncology

## 2012-04-11 VITALS — BP 125/73 | HR 94 | Temp 98.8°F | Resp 20 | Ht 73.0 in | Wt 223.9 lb

## 2012-04-11 DIAGNOSIS — I82409 Acute embolism and thrombosis of unspecified deep veins of unspecified lower extremity: Secondary | ICD-10-CM

## 2012-04-11 DIAGNOSIS — I2699 Other pulmonary embolism without acute cor pulmonale: Secondary | ICD-10-CM

## 2012-04-11 DIAGNOSIS — Z7901 Long term (current) use of anticoagulants: Secondary | ICD-10-CM

## 2012-04-11 DIAGNOSIS — I809 Phlebitis and thrombophlebitis of unspecified site: Secondary | ICD-10-CM

## 2012-04-11 DIAGNOSIS — R894 Abnormal immunological findings in specimens from other organs, systems and tissues: Secondary | ICD-10-CM

## 2012-04-11 LAB — CBC WITH DIFFERENTIAL/PLATELET
BASO%: 1.3 % (ref 0.0–2.0)
EOS%: 1.7 % (ref 0.0–7.0)
LYMPH%: 9.5 % — ABNORMAL LOW (ref 14.0–49.0)
MCHC: 33.9 g/dL (ref 32.0–36.0)
MCV: 84.4 fL (ref 79.3–98.0)
MONO%: 6.4 % (ref 0.0–14.0)
Platelets: 221 10*3/uL (ref 140–400)
RBC: 4.31 10*6/uL (ref 4.20–5.82)
WBC: 11.7 10*3/uL — ABNORMAL HIGH (ref 4.0–10.3)

## 2012-04-11 MED ORDER — HYDROCODONE-ACETAMINOPHEN 5-325 MG PO TABS: 1.0000 | ORAL_TABLET | Freq: Four times a day (QID) | ORAL | Status: DC | PRN

## 2012-04-11 NOTE — Progress Notes (Signed)
This office note has been dictated.

## 2012-04-11 NOTE — Patient Instructions (Signed)
[name]  [id number]   [facility] - AFTER VISIT SUMMARY   **RECOMMENDATIONS MADE BY THE CONSULTANT AND ANY TEST    RESULTS WILL BE SENT TO YOUR REFERRING DOCTORS.   YOUR EXAM FINDINGS, LABS AND RESULTS WERE DISCUSSED BY YOUR MD TODAY.  YOU CAN GO TO THE [place] [facility] WEB SITE FOR INSTRUCTIONS ON HOW TO ASSESS MY CHART FOR ADDITIONAL INFORMATION AS NEEDED.  Your Updated drug allergies are: Allergies as of 04/11/2012  . (No Known Allergies)    Your current list of medications are: Current Outpatient Prescriptions  Medication Sig Dispense Refill  . acetaminophen (TYLENOL) 500 MG tablet Take 1,000 mg by mouth every 6 (six) hours as needed. For pain      . amphetamine-dextroamphetamine (ADDERALL XR) 25 MG 24 hr capsule Take 50 mg by mouth every morning.       . Rivaroxaban (XARELTO) 15 MG TABS tablet Take 1 tablet (15 mg total) by mouth 2 (two) times daily with a meal.  40 tablet  0  . clonazePAM (KLONOPIN) 0.5 MG tablet Take 0.5 mg by mouth at bedtime as needed. For stress      . Multiple Vitamin (MULTIVITAMIN) tablet Take 1 tablet by mouth daily.      . Rivaroxaban (XARELTO) 20 MG TABS Take 1 tablet (20 mg total) by mouth daily with breakfast.  60 tablet  0     INSTRUCTIONS GIVEN AND DISCUSSED:  See attached schedule   SPECIAL INSTRUCTIONS/FOLLOW-UP:  See above.  I acknowledge that I have been informed and understand all the instructions given to me and received a copy.I know to contact the clinic, my physician, or go to the emergency Department if any problems should occur.   I do not have any more questions at this time, but understand that I may call the [facility] at [phone] during business hours should I have any further questions or need assistance in obtaining follow-up care.

## 2012-04-11 NOTE — Telephone Encounter (Signed)
Called Dr. Lacretia Nicks. Pharr's office and spoke with a scheduler.   Informed scheduler re: pt needs an appt with Dr. Renne Crigler very soon to evaluate pt for reason -  Recent PET scan results showed pt has thyroiditis  As per Dr. Lonell Face instructions.   Pt was given appt for Mon. 04/15/12  At  1030 am.    Message relayed to pt and wife along with appt.

## 2012-04-11 NOTE — Telephone Encounter (Signed)
s/w pt and will come in today at 10:30 per dr lo    aom

## 2012-04-11 NOTE — Telephone Encounter (Signed)
appt made and pt aware of appt on 10/22

## 2012-04-12 LAB — GLUCOSE, CAPILLARY: Glucose-Capillary: 80 mg/dL (ref 70–99)

## 2012-04-12 NOTE — Progress Notes (Signed)
CC:   Soyla Murphy. Renne Crigler, M.D.       Maryclare Bean, M.D.   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  INTERVAL HISTORY:  The patient was recently admitted for thrombolytic therapy under the guidance of Interventional Radiology.  Received lower extremity and IVC thrombolysis with stent of the left common iliac and distal IVC.  He did fairly well.  He is now home taking Xarelto.  He has had no bruising.  Bilateral lower extremity pain is much improved.  As part of his workup he underwent a PET scan to further evaluate a nodule in his lung.  The PET scan performed on 04/10/2012 showed that the right upper lobe pulmonary nodule had not changed in size measuring 5 mm.  It did not demonstrate hypermetabolism although it was below the limits of PET resolution.  However, there was diffusely increased metabolic activity throughout the thyroid gland which is nonspecific but felt to suggest thyroiditis.  There is no hypermetabolism within the liver or spleen.  MEDICATIONS:  Reviewed and updated.  PHYSICAL EXAM:  General:  The patient is alert and oriented x3.  Vitals: Pulse 94, blood pressure 125/73, temperature 98.8, respirations 20, weight 203 pounds.  HEENT:  Sclerae anicteric.  Mouth moist.  Chest/CVS: Unremarkable.  Abdomen:  Soft.  Bowel sounds present.  Extremities: Trace edema.  Pulses present and symmetrical.  IMPRESSION AND PLAN:  Mr. Koren is a 41 year old man who has bilateral pulmonary emboli and bilateral deep vein thrombosis, with presumably antiphospholipid antibody syndrome as he is lupus anticoagulant positive.  He is on anticoagulation with Xarelto.  He has received thrombolysis therapy to the left extremity and IVC with PTA to the left common iliac and distal IVC.  From that aspect the patient  continues Xarelto.  I will have him follow up with Dr. Merri Brunette as PET scan had indicated thyroiditis.  This needs evaluation and if needed treated. He will keep his previous follow up appointments. He follows up in 3 months time.    ______________________________ Laurice Record, M.D. LIO/MEDQ  D:  04/11/2012  T:  04/12/2012  Job:  [id number]

## 2012-04-16 ENCOUNTER — Other Ambulatory Visit: Payer: Self-pay | Admitting: Hematology and Oncology

## 2012-04-16 ENCOUNTER — Other Ambulatory Visit: Payer: Self-pay | Admitting: Interventional Radiology

## 2012-04-16 ENCOUNTER — Ambulatory Visit
Admission: RE | Admit: 2012-04-16 | Discharge: 2012-04-16 | Disposition: A | Discharge status: Home / Self Care | Payer: BC Managed Care – PPO | Source: Ambulatory Visit | Attending: Interventional Radiology | Admitting: Interventional Radiology

## 2012-04-16 ENCOUNTER — Ambulatory Visit
Admission: RE | Admit: 2012-04-16 | Discharge: 2012-04-16 | Disposition: A | Discharge status: Home / Self Care | Payer: BC Managed Care – PPO | Source: Ambulatory Visit | Attending: Hematology and Oncology | Admitting: Hematology and Oncology

## 2012-04-16 DIAGNOSIS — I2699 Other pulmonary embolism without acute cor pulmonale: Secondary | ICD-10-CM

## 2012-04-16 DIAGNOSIS — I82409 Acute embolism and thrombosis of unspecified deep veins of unspecified lower extremity: Secondary | ICD-10-CM

## 2012-04-16 NOTE — Progress Notes (Signed)
Pt states that he has been taking Vicodin 1 tab po bid for RLE discomfort, especially noted in the Right calf & popliteal areas.  Has been wearing thigh high graduated compression garments, 20-30 mm Hg QD during waking hours.    Last office visit w/ Dr Dalene Carrow last week.  Next f/u visit to be scheduled for Jan 2014. Office visit w/ Dr Renne Crigler on 04-15-2012.  Returned to work Full Time on 04-12-2012.  Seen by Dr Bonnielee Haff for f/u.

## 2012-04-17 ENCOUNTER — Encounter: Payer: Self-pay | Admitting: Vascular Surgery

## 2012-04-17 ENCOUNTER — Telehealth: Payer: Self-pay | Admitting: *Deleted

## 2012-04-17 NOTE — Telephone Encounter (Signed)
Call wife Selena Batten on cell phone and left message on voice mail re:  Per Dr. Dalene Carrow,  Let vascular surgeon evaluate pt and make decision about Xarelto.

## 2012-04-17 NOTE — Telephone Encounter (Signed)
Received message from wife Selena Batten re: pt to see vascular surgeon on 04/18/12.  Kim wanted to know about Xarelto management.   Called Kim back and was informed re: pt saw Dr. 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   Dr. Bonnielee Haff wanted pt to see vascular surgeon Dr. Darrick Penna ASAP - which is scheduled for 10/24.   Informed Selena Batten that it will be a consultations appt first;  Pt and Kim need to inform Dr. Darrick Penna about pt taking Xarelto.   Reinforced with Selena Batten that pt should not stop Xarelto without any mds instructions.   Kim voiced understanding. Message to Dr. Dalene Carrow for review. Mena Pauls  Phone    [phone].

## 2012-04-18 ENCOUNTER — Encounter: Payer: Self-pay | Admitting: Vascular Surgery

## 2012-04-18 ENCOUNTER — Ambulatory Visit (INDEPENDENT_AMBULATORY_CARE_PROVIDER_SITE_OTHER): Payer: BC Managed Care – PPO | Admitting: Vascular Surgery

## 2012-04-18 VITALS — BP 126/78 | HR 81 | Resp 16 | Ht 73.0 in | Wt 219.0 lb

## 2012-04-18 DIAGNOSIS — M79609 Pain in unspecified limb: Secondary | ICD-10-CM

## 2012-04-18 DIAGNOSIS — I729 Aneurysm of unspecified site: Secondary | ICD-10-CM

## 2012-04-18 NOTE — Addendum Note (Signed)
Addended by: Sharee Pimple on: 04/18/2012 03:12 PM   Modules accepted: Orders

## 2012-04-18 NOTE — Progress Notes (Signed)
[facility]  HISTORY AND PHYSICAL   History of Present Illness:  Patient is a 41 y.o. 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  The patient recently underwent bilateral lower extremity venous thrombolysis from October 10-October 13. This was successful. He was placed on Xarelto post procedure. He has had no recurrent swelling. However, he does complain of pain behind his right knee. He has only been able to go to work minimally secondary to this pain. The pain is relieved somewhat with Vicodin. 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  Other medical problems include ADHD which is stable. He was noted to have this area of pseudoaneurysm on a duplex ultrasound. He does not describe any claudication symptoms. He states he does feel a lump behind his right knee.  Past Medical History  Diagnosis Date  . ADHD (attention deficit hyperactivity disorder)     Past Surgical History  Procedure Date  . No past surgeries    left inguinal hernia repair, umbilical hernia repair   Social History History  Substance Use Topics  . Smoking status: Never Smoker   . Smokeless tobacco: Never Used  . Alcohol Use: No    Family History Family History  Problem Relation Age of Onset  . Pulmonary embolism Neg Hx     Allergies  No Known Allergies   Current Outpatient Prescriptions  Medication Sig Dispense Refill  . acetaminophen (TYLENOL) 500 MG tablet Take 1,000 mg by mouth every 6 (six) hours as needed. For pain      . amphetamine-dextroamphetamine (ADDERALL XR) 25 MG 24 hr capsule Take 50 mg by mouth every morning.       . clonazePAM (KLONOPIN) 0.5 MG tablet Take 0.5 mg by mouth at bedtime as needed. For stress      . HYDROcodone-acetaminophen (NORCO/VICODIN) 5-325 MG per tablet Take 1-2 tablets by mouth every 6 (six) hours as needed for  pain.  30 tablet  0  . Multiple Vitamin (MULTIVITAMIN) tablet Take 1 tablet by mouth daily.      . Rivaroxaban (XARELTO) 15 MG TABS tablet Take 1 tablet (15 mg total) by mouth 2 (two) times daily with a meal.  40 tablet  0  . Rivaroxaban (XARELTO) 20 MG TABS Take 1 tablet (20 mg total) by mouth daily with breakfast.  60 tablet  0    ROS:   General:  No weight loss, Fever, chills  HEENT: No recent headaches, no nasal bleeding, no visual changes, no sore throat  Neurologic: No dizziness, blackouts, seizures. No recent symptoms of stroke or mini- stroke. No recent episodes of slurred speech, or temporary blindness.  Cardiac: No recent episodes of chest pain/pressure, no shortness of breath at rest.  No shortness of breath with exertion.  Denies history of atrial fibrillation or irregular heartbeat  Vascular: No history of rest pain in feet.  No history of claudication.  No history of non-healing ulcer, + history of DVT   Pulmonary: No home oxygen, no productive cough, no hemoptysis,  No asthma or wheezing  Musculoskeletal:  [ ]  Arthritis, [ ]  Low back pain,  [ ]  Joint pain  Hematologic:+ history of hypercoagulable state.  No history of easy bleeding.  No history of anemia  Gastrointestinal: No hematochezia or melena,  No gastroesophageal reflux, no trouble swallowing  Urinary: [ ]  chronic Kidney disease, [ ]   on HD - [ ]  MWF or [ ]  TTHS, [ ]  Burning with urination, [ ]  Frequent urination, [ ]  Difficulty urinating;   Skin: No rashes  Psychological: No history of anxiety,  No history of depression   Physical Examination Filed Vitals:   04/18/12 1134  BP: 126/78  Pulse: 81  Resp: 16  Height: 6\' 1"  (1.854 m)  Weight: 219 lb (99.338 kg)  SpO2: 100%   General:  Alert and oriented, no acute distress HEENT: Normal Neck: No bruit or JVD Pulmonary: Clear to auscultation bilaterally Cardiac: Regular Rate and Rhythm without murmur Abdomen: Soft, non-tender, non-distended, no mass,  well-healed periumbilical scar and left inguinal scar Skin: No rash Extremity Pulses:  2+ radial, brachial, femoral, dorsalis pedis, posterior tibial pulses bilaterally Musculoskeletal: No deformity or edema, I am unable to palpate any mass in the popliteal fossa bilaterally Neurologic: Upper and lower extremity motor 5/5 and symmetric  DATA: Duplex ultrasound from [facility] radiology is reviewed. This shows a small branch artery possible pseudoaneurysm.  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 I'm not sure that this is large enough to be causing all of the pain symptoms that he has in his right leg. This may be more due to the trauma from his recent thrombolysis procedure. I would not consider an intervention on this currently since his thrombolysis with so recent.  I believe he would be at high-risk for recurrent venous thrombosis stopping his Xarelto at this point.  No intervention may be necessary as this may spontaneously thrombose. However if it does not I believe we should delay any intervention for at least 4-6 weeks   PLAN:  We'll obtain a CT angiogram of lower extremity runoff to better anatomically defined the pseudoaneurysm. The patient will return for followup after the CT scan. He was given a note for work today to work only half days until he sees me back.  Fabienne Bruns, MD [facility] [place] Office: [phone] Pager: [phone]

## 2012-04-24 ENCOUNTER — Encounter: Payer: Self-pay | Admitting: Vascular Surgery

## 2012-04-25 ENCOUNTER — Encounter: Payer: Self-pay | Admitting: Emergency Medicine

## 2012-04-25 ENCOUNTER — Encounter: Payer: Self-pay | Admitting: Vascular Surgery

## 2012-04-25 ENCOUNTER — Ambulatory Visit
Admission: RE | Admit: 2012-04-25 | Discharge: 2012-04-25 | Disposition: A | Discharge status: Home / Self Care | Payer: BC Managed Care – PPO | Source: Ambulatory Visit | Attending: Vascular Surgery | Admitting: Vascular Surgery

## 2012-04-25 ENCOUNTER — Ambulatory Visit (INDEPENDENT_AMBULATORY_CARE_PROVIDER_SITE_OTHER): Payer: BC Managed Care – PPO | Admitting: Vascular Surgery

## 2012-04-25 VITALS — BP 115/79 | HR 87 | Resp 16 | Ht 73.0 in | Wt 223.0 lb

## 2012-04-25 DIAGNOSIS — M79609 Pain in unspecified limb: Secondary | ICD-10-CM | POA: Insufficient documentation

## 2012-04-25 DIAGNOSIS — I729 Aneurysm of unspecified site: Secondary | ICD-10-CM

## 2012-04-25 DIAGNOSIS — I739 Peripheral vascular disease, unspecified: Secondary | ICD-10-CM

## 2012-04-25 DIAGNOSIS — I2699 Other pulmonary embolism without acute cor pulmonale: Secondary | ICD-10-CM

## 2012-04-25 DIAGNOSIS — Z01810 Encounter for preprocedural cardiovascular examination: Secondary | ICD-10-CM

## 2012-04-25 DIAGNOSIS — I82409 Acute embolism and thrombosis of unspecified deep veins of unspecified lower extremity: Secondary | ICD-10-CM

## 2012-04-25 DIAGNOSIS — K7689 Other specified diseases of liver: Secondary | ICD-10-CM

## 2012-04-25 DIAGNOSIS — I809 Phlebitis and thrombophlebitis of unspecified site: Secondary | ICD-10-CM

## 2012-04-25 MED ORDER — HYDROCODONE-ACETAMINOPHEN 5-325 MG PO TABS: 1.0000 | ORAL_TABLET | Freq: Four times a day (QID) | ORAL | Status: DC | PRN

## 2012-04-25 MED ORDER — IOHEXOL 350 MG/ML SOLN
175.0000 mL | Freq: Once | INTRAVENOUS | Status: AC | PRN
  Administered 2012-04-25: 175 mL via INTRAVENOUS

## 2012-04-25 NOTE — Progress Notes (Signed)
[facility] [place] HISTORY AND PHYSICAL   History of Present Illness:  Patient is a 41 y.o. 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 The patient recently underwent bilateral lower extremity venous thrombolysis from October 10-October 13. This was successful. He was placed on Xarelto post procedure. He has had no recurrent swelling. However, he does complain of pain behind his right knee. He has only been able to go to work minimally secondary to this pain. The pain is relieved somewhat with Vicodin. 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 Other medical problems include ADHD which is stable. He was noted to have this area of pseudoaneurysm on a duplex ultrasound. He does not describe any claudication symptoms. He states he does feel a lump behind his right knee. He states his pain is similar to his last office visit. He has been able to return to work. He is more mobile. He does not feel like his symptoms are worse. His pain is primarily in the right distal calf.   Past Medical History  Diagnosis Date  . ADHD (attention deficit hyperactivity disorder)   . DVT (deep venous thrombosis)     Past Surgical History  Procedure Date  . No past surgeries   . Rapid lysis 04/04/2012    Review of Systems:  Neurologic: denies symptoms of TIA, amaurosis, or stroke, no motor or sensory deficits in the foot Cardiac:denies shortness of breath or chest pain Pulmonary: denies cough or wheeze  Social History History  Substance Use Topics  . Smoking status: Never Smoker   . Smokeless tobacco: Never Used  . Alcohol Use: No    Allergies  No Known Allergies   Current Outpatient Prescriptions  Medication Sig Dispense Refill  . acetaminophen (TYLENOL) 500 MG tablet Take 1,000 mg by mouth every 6 (six) hours as needed. For pain      .  amphetamine-dextroamphetamine (ADDERALL XR) 25 MG 24 hr capsule Take 50 mg by mouth every morning.       . clonazePAM (KLONOPIN) 0.5 MG tablet Take 0.5 mg by mouth at bedtime as needed. For stress      . HYDROcodone-acetaminophen (NORCO/VICODIN) 5-325 MG per tablet Take 1-2 tablets by mouth every 6 (six) hours as needed for pain.  30 tablet  0  . Multiple Vitamin (MULTIVITAMIN) tablet Take 1 tablet by mouth daily.      . Rivaroxaban (XARELTO) 15 MG TABS tablet Take 1 tablet (15 mg total) by mouth 2 (two) times daily with a meal.  40 tablet  0  . Rivaroxaban (XARELTO) 20 MG TABS Take 1 tablet (20 mg total) by mouth daily with breakfast.  60 tablet  0   No current facility-administered medications for this visit.   Facility-Administered Medications Ordered in Other Visits  Medication Dose Route Frequency Provider Last Rate Last Dose  . iohexol (OMNIPAQUE) 350 MG/ML injection 175 mL  175 mL Intravenous Once PRN Medication Radiologist, MD   175 mL at 04/25/12 1144    Physical Examination  Filed Vitals:   04/25/12 1210  BP: 115/79  Pulse: 87  Resp: 16  Height: 6\' 1"  (1.854 m)  Weight: 223 lb (101.152 kg)  SpO2: 100%    Body mass index is 29.42 kg/(m^2).  General:  Alert and oriented, no acute distress HEENT: Normal Extremities: Trace edema bilateral lower extremities, palpable  mass right popliteal fossa nonpulsatile, no ecchymosis, no skin breakdown  DATA:   CT angio today.  Films were reviewed Summary: 1.  Heterogeneous fluid collection in the right popliteal region. This structure may be within the medial gastrocnemius muscle. There is an adjacent arterial structure but no clear evidence for active contrast extravasation. Findings are suggestive for the patient's known pseudoaneurysm. The pseudoaneurysm could be thrombosed   2.  Stable nodularity along the inferior right hepatic lobe and heterogeneous enhancement in the right hepatic dome. This findings are nonspecific. There is  also a slightly dense cyst in the right kidney lower pole. The liver and right kidney findings could be further evaluated with a MRI of the abdomen (with and without contrast).   3.  There are at least two right pulmonary nodules. The largest is a 5 mm nodule in the right upper lung which was present on the previous  examination. If the patient is at high risk for bronchogenic carcinoma, follow-up chest CT at 6-12 months is recommended. If the patient is at low risk for bronchogenic carcinoma, follow-up chest CT at 12 months is recommended.     ASSESSMENT: Pseudoaneurysm right popliteal fossa possible thrombosis at this point no obvious flow into the pseudoaneurysm cavity mild symptoms with right calf pain. #2 nodule liver and right kidney unknown etiology #3 recent DVT on Xarelto for anticoagulation   PLAN:  The patient return for followup in 3-4 weeks and repeat duplex his right leg. If the pseudoaneurysm persists we will need to consider an operation at that time. Hopefully it will thrombose and resorb. Since the patient is on anticoagulation this would make an I&D of his right leg more difficult. We will also schedule him for an MRI of the abdomen with and without contrast to further evaluate the liver and kidney nodule. The patient has a known pulmonary nodule and will need repeat CT scan in 1 year to follow this. I will leave this to the discretion of his primary care physician.  Fabienne Bruns, MD [facility] Office: [phone] Pager: [phone]

## 2012-06-04 ENCOUNTER — Other Ambulatory Visit: Payer: BC Managed Care – PPO

## 2012-06-05 ENCOUNTER — Encounter: Payer: Self-pay | Admitting: Vascular Surgery

## 2012-06-06 ENCOUNTER — Ambulatory Visit
Admission: RE | Admit: 2012-06-06 | Discharge: 2012-06-06 | Disposition: A | Discharge status: Home / Self Care | Payer: BC Managed Care – PPO | Source: Ambulatory Visit | Attending: Vascular Surgery | Admitting: Vascular Surgery

## 2012-06-06 ENCOUNTER — Ambulatory Visit (INDEPENDENT_AMBULATORY_CARE_PROVIDER_SITE_OTHER): Payer: BC Managed Care – PPO | Admitting: Vascular Surgery

## 2012-06-06 ENCOUNTER — Encounter: Payer: Self-pay | Admitting: Vascular Surgery

## 2012-06-06 VITALS — BP 116/76 | HR 86 | Resp 16 | Ht 73.0 in | Wt 221.7 lb

## 2012-06-06 DIAGNOSIS — I729 Aneurysm of unspecified site: Secondary | ICD-10-CM

## 2012-06-06 DIAGNOSIS — T81718A Complication of other artery following a procedure, not elsewhere classified, initial encounter: Secondary | ICD-10-CM

## 2012-06-06 DIAGNOSIS — I998 Other disorder of circulatory system: Secondary | ICD-10-CM

## 2012-06-06 DIAGNOSIS — K7689 Other specified diseases of liver: Secondary | ICD-10-CM

## 2012-06-06 DIAGNOSIS — Z01810 Encounter for preprocedural cardiovascular examination: Secondary | ICD-10-CM

## 2012-06-06 MED ORDER — GADOBENATE DIMEGLUMINE 529 MG/ML IV SOLN
20.0000 mL | Freq: Once | INTRAVENOUS | Status: AC | PRN
  Administered 2012-06-06: 20 mL via INTRAVENOUS

## 2012-06-06 NOTE — Progress Notes (Signed)
History of Present Illness: Patient is a 41 y.o. 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 The patient recently underwent bilateral lower extremity venous thrombolysis from October 10-October 13. This was successful. He was placed on Xarelto post procedure. He has had no recurrent swelling.  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 Today he reports that the pain in his right leg has completely resolved. He is back to baseline. Other medical problems include ADHD which is stable. He was also recently noted to have some problems with his thyroid gland and is under followup with endocrinologist. He was also recently noted to have proteinuria on a urinalysis and this is under evaluation as well. He was noted to have this area of pseudoaneurysm on a duplex ultrasound.   Past Medical History   Diagnosis  Date   .  ADHD (attention deficit hyperactivity disorder)    .  DVT (deep venous thrombosis)     Past Surgical History   Procedure  Date   .  No past surgeries    .  Rapid lysis  04/04/2012    Review of Systems:  Neurologic: no motor or sensory deficits in the foot  Skin: No ulcer or rash   Social History  History   Substance Use Topics   .  Smoking status:  Never Smoker   .  Smokeless tobacco:  Never Used   .  Alcohol Use:  No   Allergies  No Known Allergies   Current Outpatient Prescriptions on File Prior to Visit  Medication Sig Dispense Refill  . acetaminophen (TYLENOL) 500 MG tablet Take 1,000 mg by mouth every 6 (six) hours as needed. For pain      . amphetamine-dextroamphetamine (ADDERALL XR) 25 MG 24 hr capsule Take 50 mg by mouth every morning.       . clonazePAM (KLONOPIN) 0.5 MG tablet Take 0.5 mg by mouth at bedtime as needed. For stress      . HYDROcodone-acetaminophen (NORCO/VICODIN) 5-325 MG per tablet Take 1-2 tablets by mouth  every 6 (six) hours as needed for pain.  30 tablet  0  . levothyroxine (SYNTHROID, LEVOTHROID) 50 MCG tablet Take 1 tablet by mouth daily.      . Multiple Vitamin (MULTIVITAMIN) tablet Take 1 tablet by mouth daily.      . Rivaroxaban (XARELTO) 15 MG TABS tablet Take 1 tablet (15 mg total) by mouth 2 (two) times daily with a meal.  40 tablet  0  . Rivaroxaban (XARELTO) 20 MG TABS Take 1 tablet (20 mg total) by mouth daily with breakfast.  60 tablet  0   No current facility-administered medications on file prior to visit.    Physical Examination  Filed Vitals:   06/06/12 1154  BP: 116/76  Pulse: 86  Resp: 16  Height: 6\' 1"  (1.854 m)  Weight: 221 lb 11.2 oz (100.562 kg)  SpO2: 99%  General: Alert and oriented, no acute distress  HEENT: Normal  Extremities: no ecchymosis, no skin breakdown   DATA:  MRI of the abdomen is reviewed today. This was ordered to evaluate cysts in the liver and kidney that were seen on a previous CT scan. The liver and kidney cysts both had benign appearance. The patient also had a duplex ultrasound of the right leg today which I reviewed and interpreted. This shows the pseudoaneurysm has  resolved.  ASSESSMENT: #1 Pseudoaneurysm right popliteal fossa resolved.                          #2 nodule liver and right kidney benign cyst                           #3 recent DVT on Xarelto for anticoagulation                            #4 chest nodule will need repeat CT scan in 12 months and I will leave this at the discretion of his primary care physician  PLAN: The patient will followup with me on as-needed basis. He needs a followup CT scan of his chest in 12 months which can be arranged by his primary care physician.  Fabienne Bruns, MD  [facility]  Office: [phone]  Pager: [phone]

## 2012-06-15 ENCOUNTER — Telehealth: Payer: Self-pay | Admitting: Oncology

## 2012-06-15 NOTE — Telephone Encounter (Signed)
Called pt and left message regarding rescheduling appt from Dr.Odogwu to Dr. Jaquelyn Bitter, waiting for a call back

## 2012-06-25 ENCOUNTER — Telehealth: Payer: Self-pay | Admitting: *Deleted

## 2012-06-25 ENCOUNTER — Telehealth: Payer: Self-pay | Admitting: Oncology

## 2012-06-25 ENCOUNTER — Other Ambulatory Visit: Payer: Self-pay | Admitting: *Deleted

## 2012-06-25 DIAGNOSIS — I2699 Other pulmonary embolism without acute cor pulmonale: Secondary | ICD-10-CM

## 2012-06-25 DIAGNOSIS — I82409 Acute embolism and thrombosis of unspecified deep veins of unspecified lower extremity: Secondary | ICD-10-CM

## 2012-06-25 MED ORDER — RIVAROXABAN 20 MG PO TABS: 20.0000 mg | ORAL_TABLET | Freq: Every day | ORAL | Status: DC

## 2012-06-25 NOTE — Telephone Encounter (Signed)
Received call from wife Cala Bradford requesting refill of Xarelto.  Asked Cala Bradford if she had contacted pt's primary Dr. Renne Crigler for refill,  Wife stated " NO ".    Instructed wife to call pt's primary for refill.   Dr. Cyndie Chime notified of pt's situation.   Called pt back and was informed that pt still had not called his primary yet.  Pt stated he would run out of med on Thurs. 06/27/12.   Pt stated he did not f/u with his primary since pt has been seeing Dr. Dalene Carrow.   Pt was also not aware of f/u appts.   Gave pt appts for lab and CT Angio scan for 06/28/12.   Instructed pt to pick up appt calendar when pt comes in for lab. Informed pt that rx for Xarelto will [facility] [place] as per pt's request.  Reinforced again that pt needs to f/u with his primary for Xarelto management.  Pt voiced understanding. Pt's  Cell  Phone     [phone]. [facility]  [place]    [phone].

## 2012-06-25 NOTE — Telephone Encounter (Signed)
VM from wife states they are out of town and pt needs refill on Xarelto.  She asks that [facility] [place], [place].    It appears that Xarelto was last prescribed by Dr. Donna Bernard.  I called wife back and left VM asking her to call Dr. Donna Bernard office for refill and to call us back if any questions or if this is not correct.

## 2012-06-25 NOTE — Telephone Encounter (Signed)
Returned wife's call re confirming appts here in office. S/w wife re reassignment and gv her new appt for 1/16 w/LT @ 1:45pm. Also confirmed lb/ct appt for 1/3. Former pt of LO re-establishing w/JG.

## 2012-06-26 HISTORY — PX: COLONOSCOPY: SHX174

## 2012-06-28 ENCOUNTER — Other Ambulatory Visit (HOSPITAL_BASED_OUTPATIENT_CLINIC_OR_DEPARTMENT_OTHER): Payer: BC Managed Care – PPO

## 2012-06-28 ENCOUNTER — Ambulatory Visit (HOSPITAL_COMMUNITY)
Admission: RE | Admit: 2012-06-28 | Discharge: 2012-06-28 | Disposition: A | Discharge status: Home / Self Care | Payer: BC Managed Care – PPO | Source: Ambulatory Visit | Attending: Hematology and Oncology | Admitting: Hematology and Oncology

## 2012-06-28 ENCOUNTER — Other Ambulatory Visit: Payer: Self-pay | Admitting: *Deleted

## 2012-06-28 DIAGNOSIS — Z09 Encounter for follow-up examination after completed treatment for conditions other than malignant neoplasm: Secondary | ICD-10-CM | POA: Insufficient documentation

## 2012-06-28 DIAGNOSIS — I2699 Other pulmonary embolism without acute cor pulmonale: Secondary | ICD-10-CM

## 2012-06-28 DIAGNOSIS — O2230 Deep phlebothrombosis in pregnancy, unspecified trimester: Secondary | ICD-10-CM

## 2012-06-28 DIAGNOSIS — I82409 Acute embolism and thrombosis of unspecified deep veins of unspecified lower extremity: Secondary | ICD-10-CM

## 2012-06-28 LAB — CBC WITH DIFFERENTIAL/PLATELET
BASO%: 1.2 % (ref 0.0–2.0)
Basophils Absolute: 0.1 10*3/uL (ref 0.0–0.1)
HCT: 45.6 % (ref 38.4–49.9)
HGB: 15.8 g/dL (ref 13.0–17.1)
MONO#: 0.5 10*3/uL (ref 0.1–0.9)
NEUT%: 68.2 % (ref 39.0–75.0)
WBC: 9.1 10*3/uL (ref 4.0–10.3)
lymph#: 2 10*3/uL (ref 0.9–3.3)

## 2012-06-28 LAB — COMPREHENSIVE METABOLIC PANEL (CC13)
ALT: 26 U/L (ref 0–55)
Albumin: 3.9 g/dL (ref 3.5–5.0)
CO2: 27 mEq/L (ref 22–29)
Calcium: 9.6 mg/dL (ref 8.4–10.4)
Chloride: 109 mEq/L — ABNORMAL HIGH (ref 98–107)
Glucose: 87 mg/dl (ref 70–99)
Sodium: 144 mEq/L (ref 136–145)
Total Bilirubin: 0.58 mg/dL (ref 0.20–1.20)
Total Protein: 7.4 g/dL (ref 6.4–8.3)

## 2012-06-28 MED ORDER — IOHEXOL 350 MG/ML SOLN
100.0000 mL | Freq: Once | INTRAVENOUS | Status: AC | PRN
  Administered 2012-06-28: 100 mL via INTRAVENOUS

## 2012-06-29 ENCOUNTER — Encounter: Payer: Self-pay | Admitting: Oncology

## 2012-07-01 LAB — LUPUS ANTICOAGULANT PANEL: Lupus Anticoagulant: DETECTED — AB

## 2012-07-03 ENCOUNTER — Ambulatory Visit: Payer: BC Managed Care – PPO | Admitting: Nurse Practitioner

## 2012-07-03 ENCOUNTER — Ambulatory Visit: Payer: BC Managed Care – PPO | Admitting: Hematology and Oncology

## 2012-07-03 LAB — HEXAGONAL PHOSPHOLIPID NEUTRALIZATION: Hex Phosph Neut Test: POSITIVE — AB

## 2012-07-04 ENCOUNTER — Other Ambulatory Visit: Payer: Self-pay | Admitting: Nurse Practitioner

## 2012-07-04 ENCOUNTER — Telehealth: Payer: Self-pay | Admitting: Oncology

## 2012-07-04 NOTE — Telephone Encounter (Signed)
Talked to patient and gave him appt to see Dr. Cyndie Chime on 08/05/12, MD only

## 2012-07-11 ENCOUNTER — Ambulatory Visit: Payer: BC Managed Care – PPO | Admitting: Nurse Practitioner

## 2012-08-05 ENCOUNTER — Ambulatory Visit (HOSPITAL_BASED_OUTPATIENT_CLINIC_OR_DEPARTMENT_OTHER): Payer: BC Managed Care – PPO | Admitting: Oncology

## 2012-08-05 VITALS — BP 120/75 | HR 70 | Temp 97.0°F | Resp 20 | Ht 73.0 in | Wt 228.1 lb

## 2012-08-05 DIAGNOSIS — Z86711 Personal history of pulmonary embolism: Secondary | ICD-10-CM

## 2012-08-05 DIAGNOSIS — Z86718 Personal history of other venous thrombosis and embolism: Secondary | ICD-10-CM

## 2012-08-05 DIAGNOSIS — I2699 Other pulmonary embolism without acute cor pulmonale: Secondary | ICD-10-CM

## 2012-08-05 DIAGNOSIS — K7689 Other specified diseases of liver: Secondary | ICD-10-CM

## 2012-08-05 DIAGNOSIS — R911 Solitary pulmonary nodule: Secondary | ICD-10-CM

## 2012-08-05 DIAGNOSIS — I82409 Acute embolism and thrombosis of unspecified deep veins of unspecified lower extremity: Secondary | ICD-10-CM

## 2012-08-05 NOTE — Patient Instructions (Signed)
Lab on June 3 MD visst June 10 th

## 2012-08-05 NOTE — Progress Notes (Signed)
Hematology and Oncology Follow Up Visit  Andrew Clayton [id number] 28-May-1971 42 y.o. 08/05/2012 5:59 PM   Principle Diagnosis: Encounter Diagnoses  Name Primary?  . PE (pulmonary embolism) Yes  . DVT (deep venous thrombosis)      Interim History:I will be assuming the hematology care of this patient formerly followed by Dr. Dalene Carrow.  42 year old man who has been in overall excellent health without any major medical or surgical illness who presented in September 2013 with a 2 day history of increasing pain in his left calf. He felt some scattered, tender, nodular densities along his leg. He saw his internist. Venous Doppler studies were done on September 30. This showed acute DVT involving bilateral lower extremity veins. On the right, posterior tibial, peroneal, popliteal, femoral, common femoral, and iliac veins were involved. On the left, similar distribution of involvement with tibial, popliteal, femoral, profunda, common femoral, and iliac vein involvement.he was admitted to the hospital. CT scan of the chest showed small filling defects in the subsegmental pulmonary arteries to both lower lobes. Incidentally noted was a 5 mm pulmonary nodule right lower lobe.Subsequent CT scan of the abdomen and pelvis done on October 1, showed hepatosplenomegaly liver measuring 20.7 cm, spleen 18.4 cm. No adenopathy in the abdomen or pelvis. Area of increased attenuation in the dome of the liver over 7 cm felt likely to be an area of fatty sparing.Given the extent of the bilateral lower extremity clotting encroaching on the inferior vena cava, my guess is that the hepatosplenomegaly represents passive congestion. On October 10, he underwent catheter directed thrombolysis via bilateral popliteal vein access. A large amount of clot was encountered in the infrarenal IVC which was partially occlusive. Partially occlusive femoral popliteal DVT  left worse than right.The first procedure was only minimally effective. He  went back for a second procedure on October 12 and a followup study done the next day showed near complete resolution of thrombus on the right side with residual chronic thrombus on the left. These areas were dilated without stenting. When otherwise stable, he was started on Xarelto anticoagulant.  As part of the evaluation of the 5 mm right pulmonary nodule PET scan was done on October 16. There was diffuse hypermetabolic activity throughout the thyroid gland. No hypermetabolic activity seen in the 5 mm pulmonary nodule and no additional pulmonary nodules, masses, or adenopathy were seen .A  6-12 month interval CT scan was recommended.  Due to the increased uptake in the thyroid gland, he was referred to Dr. Lurene Shadow and he is currently on Synthroid suppression until all of the iodine contrast he got in the hospital is out of his system. Further evaluation will be done at that time.  He is asymptomatic at this time. He is wearing thigh-high elastic garments.he reports no further leg pain.  A hypercoagulation evaluation was initiated. He has a normal protein S, C., antithrombin, plasma homocystine. He tested negative for the prothrombin gene and the factor V Leiden gene mutations. He has undetectable anticardiolipin antibodies and undetectable antibodies to beta 2 glycoprotein 1. A lupus anticoagulant was positive but done on the day that he was on heparin and therefore invalid. Repeat lupus anticoagulant was done on January 3 and is also positive but may also be spurious due to the Xarelto which is a direct factor Xa. Inhibitor and the lupus anticoagulant test is done by activating factor X with dilute snake venom.      Medications: reviewed  Allergies: No Known Allergies  Review of Systems:  Constitutional:   No constitutional symptoms Respiratory:no cough or dyspnea Cardiovascular:  No chest pain or palpitations Gastrointestinal: Genito-Urinary: he was told that urine test submitted for  application to life insurance showed proteinuria Musculoskeletal: Neurologic: Skin: Remaining ROS negative.  Physical Exam: Blood pressure 120/75, pulse 70, temperature 97 F (36.1 C), temperature source Oral, resp. rate 20, height 6\' 1"  (1.854 m), weight 228 lb 1.6 oz (103.465 kg). Wt Readings from Last 3 Encounters:  08/05/12 228 lb 1.6 oz (103.465 kg)  06/06/12 221 lb 11.2 oz (100.562 kg)  04/25/12 223 lb (101.152 kg)     General appearance: well-nourished Caucasian man HENNT: pharynx no erythema exudate or mass Lymph nodes: no lymphadenopathy Breasts: Lungs:clear to auscultation resonant to percussion Heart:regular rhythm no murmur Abdomen:soft, nontender, no mass, no organomegaly Extremities:no edema, no calf tenderness Vascular:no cyanosis Neurologic:motor strength 5 over 5, reflexes 1+ symmetric, PERRLA, optic disc sharp and vessels normal. Skin:  Lab Results: Lab Results  Component Value Date   WBC 9.1 06/28/2012   HGB 15.8 06/28/2012   HCT 45.6 06/28/2012   MCV 84.0 06/28/2012   PLT 311 06/28/2012     Chemistry      Component Value Date/Time   NA 144 06/28/2012 1609   NA 140 04/04/2012 1334   K 4.4 06/28/2012 1609   K 4.0 04/04/2012 1334   CL 109* 06/28/2012 1609   CL 100 04/04/2012 1334   CO2 27 06/28/2012 1609   CO2 26 04/04/2012 1334   BUN 18.0 06/28/2012 1609   BUN 21 04/04/2012 1334   CREATININE 1.3 06/28/2012 1609   CREATININE 1.14 04/04/2012 1334      Component Value Date/Time   CALCIUM 9.6 06/28/2012 1609   CALCIUM 10.3 04/04/2012 1334   ALKPHOS 68 06/28/2012 1609   ALKPHOS 82 04/04/2012 1334   AST 25 06/28/2012 1609   AST 21 04/04/2012 1334   ALT 26 06/28/2012 1609   ALT 24 04/04/2012 1334   BILITOT 0.58 06/28/2012 1609   BILITOT 0.6 04/04/2012 1334       Impression and Plan: #1.unprovoked, extensive, bilateral, lower extremity DVT with associated low clot burden, bilateral, lower lobe pulmonary emboli in subsegmental pulmonary artery  branches.  Hypercoagulation evaluation negative so far. Lupus-type anticoagulant positive x2 but samples drawn when he was first on heparin and subsequently on Xarelto which interferes with the test leading to false positive results. He has no antibodies to beta 2 glycoprotein 1 or anticardiolipin. 5 mm pulmonary nodule in a nonsmoker likely insignificant. A CEA tumor marker done in the hospital was undetectable.  Recommendation: Given his significant clot burden and need for thrombolytic therapy, I am recommending one year of anticoagulation then reevaluate.  #2. Diffuse hypermetabolic uptake on PET scan over the thyroid. I don't know what the significance of this is. He is under evaluation by an endocrinologist.I will defer to her expertise.no focal abnormality seen in the thyroid on CT images done with the PET scan  #3. Proteinuria by history. This is just a random spot urine. It would make sense she is to repeat a spot urine and if still positive then did a 24-hour urine for total protein and creatinine clearance.most recent chemistry profile done 06/28/2012 shows BUN 18, creatinine 1.3, serum total protein 7.4, albumin 3.9. Serum protein electrophoresis done in the hospital on October 1 did not show the presence of any monoclonal proteins. Serum total immunoglobulins were normal. I will defer to his internist for further evaluation.  #4. 5 mm right pulmonary  nodule no uptake on PET scan no additional nodules or adenopathy. Nonsmoker. Plan: Interval CT scan in April which will be a six-month interval.   #6. Hepatosplenomegaly. Probably passive congestion in view of extensive bilateral lower extremity clot extending into the IVC. We will take another look when he gets a repeat CT scan in April.  CC:. Dr. Merri Brunette; Dr.Bindubal Achilles Dunk, MD 2/10/20145:59 PM

## 2012-08-06 ENCOUNTER — Telehealth: Payer: Self-pay | Admitting: Oncology

## 2012-08-06 NOTE — Telephone Encounter (Signed)
Talked to patient gave him appt for June 2014 lab before MD

## 2012-09-12 ENCOUNTER — Other Ambulatory Visit (HOSPITAL_COMMUNITY): Payer: Self-pay | Admitting: Interventional Radiology

## 2012-09-12 DIAGNOSIS — I82403 Acute embolism and thrombosis of unspecified deep veins of lower extremity, bilateral: Secondary | ICD-10-CM

## 2012-09-16 ENCOUNTER — Other Ambulatory Visit: Payer: Self-pay | Admitting: Internal Medicine

## 2012-09-16 DIAGNOSIS — I82403 Acute embolism and thrombosis of unspecified deep veins of lower extremity, bilateral: Secondary | ICD-10-CM

## 2012-10-02 ENCOUNTER — Ambulatory Visit
Admission: RE | Admit: 2012-10-02 | Discharge: 2012-10-02 | Disposition: A | Discharge status: Home / Self Care | Payer: BC Managed Care – PPO | Source: Ambulatory Visit | Attending: Interventional Radiology | Admitting: Interventional Radiology

## 2012-10-02 ENCOUNTER — Ambulatory Visit
Admission: RE | Admit: 2012-10-02 | Discharge: 2012-10-02 | Disposition: A | Discharge status: Home / Self Care | Payer: BC Managed Care – PPO | Source: Ambulatory Visit | Attending: Internal Medicine | Admitting: Internal Medicine

## 2012-10-02 DIAGNOSIS — I82403 Acute embolism and thrombosis of unspecified deep veins of lower extremity, bilateral: Secondary | ICD-10-CM

## 2012-11-26 ENCOUNTER — Other Ambulatory Visit (HOSPITAL_BASED_OUTPATIENT_CLINIC_OR_DEPARTMENT_OTHER): Payer: BC Managed Care – PPO

## 2012-11-26 DIAGNOSIS — I2699 Other pulmonary embolism without acute cor pulmonale: Secondary | ICD-10-CM

## 2012-11-26 DIAGNOSIS — Z86718 Personal history of other venous thrombosis and embolism: Secondary | ICD-10-CM

## 2012-11-26 DIAGNOSIS — I82409 Acute embolism and thrombosis of unspecified deep veins of unspecified lower extremity: Secondary | ICD-10-CM

## 2012-11-26 DIAGNOSIS — Z86711 Personal history of pulmonary embolism: Secondary | ICD-10-CM

## 2012-11-26 LAB — COMPREHENSIVE METABOLIC PANEL (CC13)
AST: 30 U/L (ref 5–34)
Albumin: 4.1 g/dL (ref 3.5–5.0)
Alkaline Phosphatase: 80 U/L (ref 40–150)
Glucose: 92 mg/dl (ref 70–99)
Potassium: 4.1 mEq/L (ref 3.5–5.1)
Sodium: 139 mEq/L (ref 136–145)
Total Bilirubin: 0.89 mg/dL (ref 0.20–1.20)
Total Protein: 7.8 g/dL (ref 6.4–8.3)

## 2012-11-26 LAB — CBC WITH DIFFERENTIAL/PLATELET
BASO%: 1.2 % (ref 0.0–2.0)
Basophils Absolute: 0.1 10*3/uL (ref 0.0–0.1)
EOS%: 3.1 % (ref 0.0–7.0)
Eosinophils Absolute: 0.4 10*3/uL (ref 0.0–0.5)
HCT: 45.4 % (ref 38.4–49.9)
HGB: 15.5 g/dL (ref 13.0–17.1)
LYMPH%: 17.6 % (ref 14.0–49.0)
MCH: 28.3 pg (ref 27.2–33.4)
MCHC: 34.2 g/dL (ref 32.0–36.0)
MCV: 82.8 fL (ref 79.3–98.0)
MONO#: 0.6 10*3/uL (ref 0.1–0.9)
MONO%: 5.5 % (ref 0.0–14.0)
NEUT#: 8.2 10*3/uL — ABNORMAL HIGH (ref 1.5–6.5)
NEUT%: 72.6 % (ref 39.0–75.0)
Platelets: 369 10*3/uL (ref 140–400)
RBC: 5.48 10*6/uL (ref 4.20–5.82)
RDW: 14.1 % (ref 11.0–14.6)
WBC: 11.3 10*3/uL — ABNORMAL HIGH (ref 4.0–10.3)
lymph#: 2 10*3/uL (ref 0.9–3.3)

## 2012-11-28 LAB — PROTHROMBIN TIME: INR: 2.97 — ABNORMAL HIGH (ref <1.50)

## 2012-11-28 LAB — FACTOR 8 ASSAY: Coagulation Factor VIII: 26 % — ABNORMAL LOW (ref 73–140)

## 2012-12-03 ENCOUNTER — Telehealth: Payer: Self-pay | Admitting: Oncology

## 2012-12-03 ENCOUNTER — Ambulatory Visit (HOSPITAL_BASED_OUTPATIENT_CLINIC_OR_DEPARTMENT_OTHER): Payer: BC Managed Care – PPO | Admitting: Oncology

## 2012-12-03 VITALS — BP 139/83 | HR 83 | Temp 98.1°F | Resp 18 | Ht 73.0 in | Wt 227.9 lb

## 2012-12-03 DIAGNOSIS — I82409 Acute embolism and thrombosis of unspecified deep veins of unspecified lower extremity: Secondary | ICD-10-CM

## 2012-12-03 DIAGNOSIS — R911 Solitary pulmonary nodule: Secondary | ICD-10-CM

## 2012-12-03 DIAGNOSIS — I2699 Other pulmonary embolism without acute cor pulmonale: Secondary | ICD-10-CM

## 2012-12-03 DIAGNOSIS — R809 Proteinuria, unspecified: Secondary | ICD-10-CM

## 2012-12-03 DIAGNOSIS — I82403 Acute embolism and thrombosis of unspecified deep veins of lower extremity, bilateral: Secondary | ICD-10-CM

## 2012-12-04 NOTE — Progress Notes (Signed)
Hematology and Oncology Follow Up Visit  Andrew Clayton [id number] [birth year] 42 y.o. 12/04/2012 4:53 PM   Principle Diagnosis: Encounter Diagnoses  Name Primary?  . PE (pulmonary embolism)   . DVT (deep venous thrombosis), bilateral Yes  . Nodule of right lung   . Proteinuria      Interim History:   Followup visit for this 42 year old man I saw for the first time back in February. Please see my 08/05/2012 note for complete details. Briefly, he presented in September 2013 with left calf pain and was found to have extensive deep venous thrombosis involving bilateral lower extremity veins. CT scan of the chest showed small subsegmental bilateral lower lobe pulmonary emboli. Incidentally noted was a 5 mm right lower lobe pulmonary nodule. Initial CT abdomen showed borderline hepatosplenomegaly but no adenopathy. He underwent thrombolytic therapy and had significant lysis of clot on the right side but not on the left. He was started on Xarelto anticoagulation. Further evaluation of the pulmonary nodule included a PET scan done 04/10/2012 which showed diffuse hypermetabolic activity throughout the thyroid gland but no increased PET activity over the pulmonary nodule and no other areas to suggest occult malignancy as etiology of his clot. A CEA level was normal. Subsequent evaluation of his thyroid reveals that he has had Hashimoto's thyroiditis. He is currently on Synthroid suppression 75 mcg daily.  Hypercoagulation profile unrevealing except for a positive lupus-type anticoagulant x2 with first test likely done while he was getting heparin and second test done while he was on Xarelto. Both of these medications invalidate and this test. He had undetectable anticardiolipin antibodies and undetectable antibodies to beta 2 glycoprotein 1. This makes it unlikely that he has a true antiphospholipid antibody syndrome.  He recently developed rectal bleeding. This was found to be due to hemorrhoids. He  underwent a colonoscopy 3 months ago which was otherwise unrevealing except for some precancerous polyps which were removed.  2 weeks ago he developed an orchitis and is currently on antibiotics.  I did some additional lab testing in anticipation of today's visit. D.-dimer is now normal at 0.41. A factor VIII level he is only 26%. This does not make any sense. I wonder if the Xarelto also interferes with this assay?  Overall he just does not feel well.  Another unresolved issue was findings of proteinuria. He tells me she was checked twice as part of an insurance physical to get life insurance and he was downgraded because of it.    Medications: reviewed  Allergies: No Known Allergies  Review of Systems: Constitutional:   Gen. malaise and fatigue Respiratory: No cough or dyspnea Cardiovascular:  No chest pain or palpitations Gastrointestinal: See above Genito-Urinary: See above Musculoskeletal: Intermittent muscle and joint pain Neurologic: No headache or change in vision Skin: No rash or ecchymosis Remaining ROS negative.  Physical Exam: Blood pressure 139/83, pulse 83, temperature 98.1 F (36.7 C), temperature source Oral, resp. rate 18, height 6\' 1"  (1.854 m), weight 227 lb 14.4 oz (103.375 kg). Wt Readings from Last 3 Encounters:  12/03/12 227 lb 14.4 oz (103.375 kg)  08/05/12 228 lb 1.6 oz (103.465 kg)  06/06/12 221 lb 11.2 oz (100.562 kg)     General appearance: Well-nourished Caucasian man, weight is stable  Lab Results: Lab Results:  White count differential 73% neutrophils, 18% lymphocytes, 5% monocytes, 3% eosinophils.   Component Value Date   WBC 11.3* 11/26/2012   HGB 15.5 11/26/2012   HCT 45.4 11/26/2012   MCV 82.8 11/26/2012  PLT 369 11/26/2012     Chemistry      Component Value Date/Time   NA 139 11/26/2012 1311   NA 140 04/04/2012 1334   K 4.1 11/26/2012 1311   K 4.0 04/04/2012 1334   CL 105 11/26/2012 1311   CL 100 04/04/2012 1334   CO2 24 11/26/2012 1311    CO2 26 04/04/2012 1334   BUN 18.1 11/26/2012 1311   BUN 21 04/04/2012 1334   CREATININE 1.1 11/26/2012 1311   CREATININE 1.14 04/04/2012 1334      Component Value Date/Time   CALCIUM 9.8 11/26/2012 1311   CALCIUM 10.3 04/04/2012 1334   ALKPHOS 80 11/26/2012 1311   ALKPHOS 82 04/04/2012 1334   AST 30 11/26/2012 1311   AST 21 04/04/2012 1334   ALT 40 11/26/2012 1311   ALT 24 04/04/2012 1334   BILITOT 0.89 11/26/2012 1311   BILITOT 0.6 04/04/2012 1334       Radiological Studies: Most recent followup CT scan of the chest done 06/28/2012 showed no residual pulmonary emboli. Stable 4 mm right upper lobe lung nodule "highly likely to be benign". Scans through the upper abdomen normal. .  Impression: #1. Unprovoked, bilateral, proximal, lower extremity DVT in an otherwise healthy young man. He is now been on anticoagulation for 10 months. We discussed together with him and his wife risks versus benefit of long-term anticoagulation in the face of idiopathic thrombosis. I will continue anticoagulation at this time and reevaluate again in 2 months.  There is still a concern that he may have an underlying systemic illness. He was recently found to have thyroiditis. I would be concerned with other autoimmune diseases. Trying to tie in some of his other problems such as the proteinuria, I'm going to go ahead and check serum immunoglobulin studies and a cryoglobulin screen. I will get a 24-hour urine for total protein, IFE, and creatinine clearance. I will repeat the factor VIII level but I feel it was likely spurious. Elevated factor VIII levels are associated with thrombosis in particular pulmonary emboli but his low level does not fit the clinical picture.  #2. Hashimoto's thyroiditis Currently under evaluation and treatment  #3. Stable, subcentimeter, right pulmonary nodule. Likely benign. I will get one more CT scan and if this shows stability then I will not get repetitive scans in the  future.      CC:. Dr. Merri Brunette; Dr Mora Bellman, MD 6/11/20144:53 PM

## 2012-12-11 ENCOUNTER — Telehealth: Payer: Self-pay | Admitting: Oncology

## 2012-12-13 ENCOUNTER — Ambulatory Visit: Payer: BC Managed Care – PPO

## 2012-12-13 DIAGNOSIS — I82403 Acute embolism and thrombosis of unspecified deep veins of lower extremity, bilateral: Secondary | ICD-10-CM

## 2012-12-13 DIAGNOSIS — R809 Proteinuria, unspecified: Secondary | ICD-10-CM

## 2012-12-13 LAB — BASIC METABOLIC PANEL
BUN: 16 mg/dL (ref 6–23)
Calcium: 9.7 mg/dL (ref 8.4–10.5)
Creatinine, Ser: 1.13 mg/dL (ref 0.50–1.35)
Glucose, Bld: 116 mg/dL — ABNORMAL HIGH (ref 70–99)

## 2012-12-16 ENCOUNTER — Other Ambulatory Visit (HOSPITAL_COMMUNITY): Payer: BC Managed Care – PPO

## 2012-12-17 LAB — UIFE/LIGHT CHAINS/TP QN, 24-HR UR
Albumin, U: DETECTED
Alpha 1, Urine: DETECTED — AB
Alpha 2, Urine: DETECTED — AB
Beta, Urine: DETECTED — AB
Free Kappa Lt Chains,Ur: 4.17 mg/dL — ABNORMAL HIGH (ref 0.14–2.42)
Free Kappa/Lambda Ratio: 8.69 ratio (ref 2.04–10.37)
Free Lambda Excretion/Day: 6.24 mg/d
Free Lambda Lt Chains,Ur: 0.48 mg/dL (ref 0.02–0.67)
Free Lt Chn Excr Rate: 54.21 mg/d
Gamma Globulin, Urine: DETECTED — AB
Time: 24 h
Total Protein, Urine-Ur/day: 641 mg/d — ABNORMAL HIGH (ref 10–140)
Total Protein, Urine: 49.3 mg/dL
Volume, Urine: 1300 mL

## 2012-12-17 LAB — CREATININE CLEARANCE, URINE, 24 HOUR
Collection Interval-CRCL: 24 hours
Creatinine Clearance: 149 mL/min — ABNORMAL HIGH (ref 75–125)
Creatinine: 1.11 mg/dL (ref 0.50–1.35)

## 2012-12-19 LAB — IMMUNOFIXATION ELECTROPHORESIS
IgA: 318 mg/dL (ref 68–379)
IgM, Serum: 111 mg/dL (ref 41–251)

## 2012-12-19 LAB — CRYOGLOBULIN

## 2012-12-23 ENCOUNTER — Ambulatory Visit (HOSPITAL_COMMUNITY)
Admission: RE | Admit: 2012-12-23 | Discharge: 2012-12-23 | Disposition: A | Discharge status: Home / Self Care | Payer: BC Managed Care – PPO | Source: Ambulatory Visit | Attending: Oncology | Admitting: Oncology

## 2012-12-23 DIAGNOSIS — N281 Cyst of kidney, acquired: Secondary | ICD-10-CM | POA: Insufficient documentation

## 2012-12-23 DIAGNOSIS — R911 Solitary pulmonary nodule: Secondary | ICD-10-CM

## 2012-12-23 DIAGNOSIS — R918 Other nonspecific abnormal finding of lung field: Secondary | ICD-10-CM | POA: Insufficient documentation

## 2012-12-23 MED ORDER — IOHEXOL 300 MG/ML  SOLN
80.0000 mL | Freq: Once | INTRAMUSCULAR | Status: AC | PRN
  Administered 2012-12-23: 80 mL via INTRAVENOUS

## 2013-01-17 ENCOUNTER — Other Ambulatory Visit: Payer: BC Managed Care – PPO | Admitting: Lab

## 2013-01-17 ENCOUNTER — Ambulatory Visit (HOSPITAL_BASED_OUTPATIENT_CLINIC_OR_DEPARTMENT_OTHER): Payer: BC Managed Care – PPO | Admitting: Oncology

## 2013-01-17 ENCOUNTER — Telehealth: Payer: Self-pay | Admitting: Oncology

## 2013-01-17 VITALS — BP 128/87 | HR 69 | Temp 97.0°F | Resp 18 | Ht 73.0 in | Wt 232.8 lb

## 2013-01-17 DIAGNOSIS — I82403 Acute embolism and thrombosis of unspecified deep veins of lower extremity, bilateral: Secondary | ICD-10-CM

## 2013-01-17 DIAGNOSIS — I2699 Other pulmonary embolism without acute cor pulmonale: Secondary | ICD-10-CM

## 2013-01-17 DIAGNOSIS — I82409 Acute embolism and thrombosis of unspecified deep veins of unspecified lower extremity: Secondary | ICD-10-CM

## 2013-01-17 DIAGNOSIS — D689 Coagulation defect, unspecified: Secondary | ICD-10-CM

## 2013-01-17 NOTE — Telephone Encounter (Signed)
S/w pt re appts for 10/6 and 10/13.

## 2013-01-18 ENCOUNTER — Other Ambulatory Visit: Payer: Self-pay | Admitting: Oncology

## 2013-01-18 DIAGNOSIS — R809 Proteinuria, unspecified: Secondary | ICD-10-CM

## 2013-01-18 NOTE — Progress Notes (Signed)
Hematology and Oncology Follow Up Visit  Diamond Martucci [id number] 03-08-1971 42 y.o. 01/18/2013 12:04 PM   Principle Diagnosis: Encounter Diagnoses  Name Primary?  . PE (pulmonary embolism) Yes  . DVT (deep venous thrombosis), bilateral   . Coagulopathy      Interim History:   Followup visit for this 42 year old man and his wife to review recent additional laboratory and diagnostic studies obtained for further evaluation of idiopathic venous thrombosis which occurred in September 2013 and included bilateral lower extremity DVT as well as low volume bilateral pulmonary emboli. Please see recent office notes for complete details. He had a positive lupus-type anticoagulant test x2 but not clear if these are accurate due to the fact they were done while he was on either heparin or Xarelto anticoagulation which can affect the test. He tested negative for antiphospholipid antibodies.  He has developed increasing constitutional symptoms over the last year which he cannot explain. He was told that he had proteinuria on a recent insurance physical. There was some question whether or not he had hepatosplenomegaly on initial CT scan done at the time of his acute thrombotic event.  I got a followup CT scan of his chest and upper abdomen. I have personally reviewed these images. There is no suggestion that he has hepatosplenomegaly. No intra-abdominal adenopathy. Tiny subcentimeter pulmonary nodules have remained stable and are likely benign.  Serum immunoglobulins are normal with no monoclonal protein on immunofixation electrophoresis. A cryoglobulin screen is negative. A 24-hour urine does show elevated protein of 641 mg. This is nonselective proteinuria. There is the usual disclaimer by the pathologist that there is a "slightly restricted area of mobility in the IgG kappa lane,  suggest repeat in 6 months if clinically indicated".  He has a reproducibly low clotting factor VIII activity first checked  June 3 and was 26%, repeated June 20 and was 29%. This is an anomaly. He has had clotting problems not bleeding problems. There is no family history of any hemophilia or other bleeding disorder. I cannot explain this finding. I'm going to repeat the test at an interval and do mixing studies to exclude presence of an inhibitor. However, if there was an inhibitor to coagulation present, once again, this would predispose towards bleeding and not clotting.  I think he needs a kidney biopsy at this point and I'm going to refer him to nephrology.     Medications: reviewed  Allergies: No Known Allergies    Physical Exam: Blood pressure 128/87, pulse 69, temperature 97 F (36.1 C), temperature source Oral, resp. rate 18, height 6\' 1"  (1.854 m), weight 232 lb 12.8 oz (105.597 kg). Wt Readings from Last 3 Encounters:  01/17/13 232 lb 12.8 oz (105.597 kg)  12/03/12 227 lb 14.4 oz (103.375 kg)  08/05/12 228 lb 1.6 oz (103.465 kg)       Lab Results: Lab Results  Component Value Date   WBC 11.3* 11/26/2012   HGB 15.5 11/26/2012   HCT 45.4 11/26/2012   MCV 82.8 11/26/2012   PLT 369 11/26/2012     Chemistry      Component Value Date/Time   NA 139 12/13/2012 1621   NA 139 11/26/2012 1311   K 4.0 12/13/2012 1621   K 4.1 11/26/2012 1311   CL 105 12/13/2012 1621   CL 105 11/26/2012 1311   CO2 21 12/13/2012 1621   CO2 24 11/26/2012 1311   BUN 16 12/13/2012 1621   BUN 18.1 11/26/2012 1311   CREATININE 1.13 12/13/2012 1621  CREATININE 1.11 12/13/2012 1621   CREATININE 1.1 11/26/2012 1311      Component Value Date/Time   CALCIUM 9.7 12/13/2012 1621   CALCIUM 9.8 11/26/2012 1311   ALKPHOS 80 11/26/2012 1311   ALKPHOS 82 04/04/2012 1334   AST 30 11/26/2012 1311   AST 21 04/04/2012 1334   ALT 40 11/26/2012 1311   ALT 24 04/04/2012 1334   BILITOT 0.89 11/26/2012 1311   BILITOT 0.6 04/04/2012 1334       Radiological Studies: Ct Chest W Contrast  12/23/2012   *RADIOLOGY REPORT*  Clinical Data: Follow-up evaluation for  small pulmonary nodules.  CT CHEST WITH CONTRAST  Technique:  Multidetector CT imaging of the chest was performed following the standard protocol during bolus administration of intravenous contrast.  Contrast: 80mL OMNIPAQUE IOHEXOL 300 MG/ML  SOLN  Comparison: Chest c t 06/28/2012.  Findings:  Mediastinum: Heart size is normal. There is no significant pericardial fluid, thickening or pericardial calcification. No pathologically enlarged mediastinal or hilar lymph nodes. Esophagus is unremarkable in appearance.  Lungs/Pleura: Linear opacities in the lower lobes of the lungs bilaterally and in the inferior segment of the lingula are unchanged compared to the prior study, compatible with areas of chronic scarring.  Previously noted 5 mm right upper lobe nodule (image 29 of series 5) is unchanged.  There is also a 4 mm right middle lobe nodule (image 40 of series 5), which is also unchanged in retrospect compared to the prior examination. In fact, both of these nodules are unchanged compared to prior study from 03/25/2012.  No other larger or suspicious appearing pulmonary nodules or masses are otherwise noted. No acute consolidative air space disease.  No pleural effusions.  Upper Abdomen: 1.2 cm cyst in the lateral aspect of the interpolar region of the right kidney is unchanged compared to prior study 03/26/2012.  Musculoskeletal: There are no aggressive appearing lytic or blastic lesions noted in the visualized portions of the skeleton.  IMPRESSION: 1.  5 mm right upper lobe nodule and 4 mm right middle lobe nodules are both unchanged compared to prior study dating back to 03/25/2012.  These are strongly favored to be benign in this young patient. 2.  Additional incidental findings, as above.   Original Report Authenticated By: Trudie Reed, M.D.    Impression: #1. 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 He has now been on anticoagulation for almost one year. There  is still a question on the validity of the lupus anticoagulant tests done recently. I'm going to have him stop his Xarelto in October when he completes one year of therapy, wait one week and then check a lupus anticoagulant test off all anticoagulants. I will repeat clotting factor VIII studies at that time with mixing studies to evaluate whether he actually has a factor deficiency or inhibitor of coagulation present.  #2. Nonselective proteinuria 641 mg BUN and creatinine are normal at 16 and 1.1 respectively.. Creatinine clearance is 149 mL per minute. He is only 42 years old. Although he doesn't meet diagnostic criteria yet for the nephrotic syndrome, one wonders if his thrombotic events are related to an underlying nephrotic syndrome? I would like to refer him to nephrology for evaluation and consideration of a kidney biopsy.   Direct face-to-face contact with this patient and his wife at least 45 minutes.        Levert Feinstein, MD 7/26/201412:04 PM

## 2013-01-20 ENCOUNTER — Telehealth: Payer: Self-pay | Admitting: Oncology

## 2013-01-20 NOTE — Telephone Encounter (Signed)
Called [facility] left message, referred pt to Dr. Casimiro Needle, waiting for return call

## 2013-02-07 ENCOUNTER — Other Ambulatory Visit: Payer: Self-pay | Admitting: Oncology

## 2013-02-07 DIAGNOSIS — I82409 Acute embolism and thrombosis of unspecified deep veins of unspecified lower extremity: Secondary | ICD-10-CM

## 2013-02-07 DIAGNOSIS — D689 Coagulation defect, unspecified: Secondary | ICD-10-CM

## 2013-02-07 DIAGNOSIS — I2699 Other pulmonary embolism without acute cor pulmonale: Secondary | ICD-10-CM

## 2013-03-10 ENCOUNTER — Telehealth: Payer: Self-pay | Admitting: *Deleted

## 2013-03-10 ENCOUNTER — Telehealth: Payer: Self-pay | Admitting: Oncology

## 2013-03-10 NOTE — Telephone Encounter (Signed)
Talked to [place] from [facility] and she want notes to be faxed again, gave to [facility] @ [facility], ansd will follow up, will inform patient

## 2013-03-10 NOTE — Telephone Encounter (Signed)
Received call from pt's wife stating that Dr. Cyndie Chime wanted her husband to see a kidney doctor regarding protein in his urine & they have not heard from anyone.  Note in chart fromJulystates that referral was made.  Called [facility] & another message was left re this referral & asked that they call us.  Message also given to scheduler/Rose & she will also check on this.

## 2013-03-31 ENCOUNTER — Other Ambulatory Visit (HOSPITAL_BASED_OUTPATIENT_CLINIC_OR_DEPARTMENT_OTHER): Payer: BC Managed Care – PPO | Admitting: Lab

## 2013-03-31 DIAGNOSIS — I2699 Other pulmonary embolism without acute cor pulmonale: Secondary | ICD-10-CM

## 2013-03-31 DIAGNOSIS — D689 Coagulation defect, unspecified: Secondary | ICD-10-CM

## 2013-03-31 DIAGNOSIS — I82409 Acute embolism and thrombosis of unspecified deep veins of unspecified lower extremity: Secondary | ICD-10-CM

## 2013-03-31 DIAGNOSIS — I82403 Acute embolism and thrombosis of unspecified deep veins of lower extremity, bilateral: Secondary | ICD-10-CM

## 2013-03-31 LAB — CBC WITH DIFFERENTIAL/PLATELET
BASO%: 0.8 % (ref 0.0–2.0)
EOS%: 2.3 % (ref 0.0–7.0)
HCT: 44 % (ref 38.4–49.9)
LYMPH%: 16.2 % (ref 14.0–49.0)
MCH: 29.4 pg (ref 27.2–33.4)
MCHC: 34.8 g/dL (ref 32.0–36.0)
MONO%: 6.5 % (ref 0.0–14.0)
NEUT%: 74.2 % (ref 39.0–75.0)
lymph#: 1.9 10*3/uL (ref 0.9–3.3)

## 2013-04-03 LAB — LUPUS ANTICOAGULANT PANEL
DRVVT 1:1 Mix: 37.5 secs (ref <42.9)
Lupus Anticoagulant: NOT DETECTED
PTT Lupus Anticoagulant: 40.6 secs (ref 28.0–43.0)

## 2013-04-07 ENCOUNTER — Ambulatory Visit (HOSPITAL_BASED_OUTPATIENT_CLINIC_OR_DEPARTMENT_OTHER): Payer: BC Managed Care – PPO | Admitting: Oncology

## 2013-04-07 VITALS — BP 127/79 | HR 76 | Temp 97.1°F | Resp 20 | Ht 73.0 in | Wt 229.5 lb

## 2013-04-07 DIAGNOSIS — I2699 Other pulmonary embolism without acute cor pulmonale: Secondary | ICD-10-CM

## 2013-04-07 DIAGNOSIS — I82409 Acute embolism and thrombosis of unspecified deep veins of unspecified lower extremity: Secondary | ICD-10-CM

## 2013-04-07 DIAGNOSIS — D72829 Elevated white blood cell count, unspecified: Secondary | ICD-10-CM

## 2013-04-07 DIAGNOSIS — R911 Solitary pulmonary nodule: Secondary | ICD-10-CM

## 2013-04-08 NOTE — Progress Notes (Signed)
Hematology and Oncology Follow Up Visit  Andrew Clayton [id number] February 07, 1971 42 y.o. 04/08/2013 1:27 PM   Principle Diagnosis: Encounter Diagnoses  Name Primary?  . PE (pulmonary embolism) Yes  . DVT (deep venous thrombosis), unspecified laterality   . Leukocytosis      Interim History:   Short interim followup visit for this rather complicated 42 year old man initially evaluated in our office by another physician one year ago in October 2013 after he sustained unprovoked bilateral lower extremity DVTs and low volume bilateral pulmonary emboli. He underwent thrombolytic therapy which was successful in lysing the majority of the clot on the right but only partially on the left. He was started on anticoagulation with Xarelto. Incidentally noted during the evaluation was diffuse hypermetabolic activity throughout the thyroid gland on a PET scan done 04/10/2012. He subsequently saw an endocrinologist and was put on thyroid suppression. A hypercoagulation evaluation was unremarkable except for a positive lupus-type anticoagulant likely spurious since it was done while he was on heparin and subsequently repeated on Xarelto. I repeated this test off the Xarelto on 03/31/2013 and it  is now negative.  At time of his initial visit with me on 08/05/2012, I recommended 1 total year of anticoagulation then reevaluation. During the interview, he was told that a insurance physical revealed proteinuria. To exclude possibility of the nephrotic syndrome as etiology of his clotting, I obtained a number of additional studies. A screen for cryoglobulins was negative. 24 hour urine collection done on 12/13/2012 showed total protein of 641 mg of nonselective proteinuria and no clear monoclonal proteins on IFE. Creatinine clearance 149 mL per minute. BUN 16, creatinine 1.1. Serum immunoglobulins all normal and no monoclonal proteins on IFE of the serum. He has a normal hemoglobin but a slightly elevated white blood  count of 12,000 with a normal white count differential. D-dimer normalized and on June 3 was 0.41.  I checked a clotting factor VIII level as part of his hypercoagulation evaluation and unexpectedly it came back extremely low at 26% which makes no sense in somebody who is clotting. Repeat value at a three-week interval remained low. A third value now in the normal range at 88% done on 03/31/2013 reference range 73-140.  Initial CT scan of the chest done in the hospital showed a tiny subcentimeter pulmonary nodule and a suggestion of splenomegaly. Followup CT scan done on 12/23/2012 does not confirm splenomegaly and shows a stable 5 mm right upper lobe nodule and a 4 mm right middle lobe nodule. The patient is a nonsmoker. He had some bleeding hemorrhoids. In view of his above clotting history, he was referred for a colonoscopy which was negative except for a few benign polyps which were removed.  I referred him for a nephrology evaluation. I just received a report today from Dr. Casimiro Needle. Urine did not have an active sediment. He did not feel it proteinuria was in the nephrotic range and did not see a indication for a kidney biopsy unless level of proteinuria increases over time. He will continue to follow the patient. He did not feel that he met criteria for the nephrotic syndrome as etiology of his thrombotic event. He could not exclude the possibility of a renal vein thrombosis. He check complement levels and an ANA and they were normal..      Medications: reviewed  Allergies: No Known Allergies  Review of Systems: Constitutional:   His constitutional symptoms have resolved since his last visit  Remaining ROS negative.  Physical Exam: Blood pressure 127/79, pulse 76, temperature 97.1 F (36.2 C), temperature source Oral, resp. rate 20, height 6\' 1"  (1.854 m), weight 229 lb 8 oz (104.101 kg). Wt Readings from Last 3 Encounters:  04/07/13 229 lb 8 oz (104.101 kg)  01/17/13 232  lb 12.8 oz (105.597 kg)  12/03/12 227 lb 14.4 oz (103.375 kg)     Multiple recent exams normal and exam not repeated today  Lab Results: CBC W/Diff    Component Value Date/Time   WBC 12.0* 03/31/2013 1533   WBC 9.8 04/08/2012 0612   RBC 5.19 03/31/2013 1533   RBC 4.19* 04/08/2012 0612   HGB 15.3 03/31/2013 1533   HGB 11.7* 04/08/2012 0612   HCT 44.0 03/31/2013 1533   HCT 35.8* 04/08/2012 0612   PLT 370 03/31/2013 1533   PLT 158 04/08/2012 0612   MCV 84.7 03/31/2013 1533   MCV 85.4 04/08/2012 0612   MCH 29.4 03/31/2013 1533   MCH 27.9 04/08/2012 0612   MCHC 34.8 03/31/2013 1533   MCHC 32.7 04/08/2012 0612   RDW 13.8 03/31/2013 1533   RDW 15.3 04/08/2012 0612   LYMPHSABS 1.9 03/31/2013 1533   LYMPHSABS 2.0 03/25/2012 1441   MONOABS 0.8 03/31/2013 1533   MONOABS 1.0 03/25/2012 1441   EOSABS 0.3 03/31/2013 1533   EOSABS 0.5 03/25/2012 1441   BASOSABS 0.1 03/31/2013 1533   BASOSABS 0.1 03/25/2012 1441     Chemistry      Component Value Date/Time   NA 139 12/13/2012 1621   NA 139 11/26/2012 1311   K 4.0 12/13/2012 1621   K 4.1 11/26/2012 1311   CL 105 12/13/2012 1621   CL 105 11/26/2012 1311   CO2 21 12/13/2012 1621   CO2 24 11/26/2012 1311   BUN 16 12/13/2012 1621   BUN 18.1 11/26/2012 1311   CREATININE 1.13 12/13/2012 1621   CREATININE 1.11 12/13/2012 1621   CREATININE 1.1 11/26/2012 1311      Component Value Date/Time   CALCIUM 9.7 12/13/2012 1621   CALCIUM 9.8 11/26/2012 1311   ALKPHOS 80 11/26/2012 1311   ALKPHOS 82 04/04/2012 1334   AST 30 11/26/2012 1311   AST 21 04/04/2012 1334   ALT 40 11/26/2012 1311   ALT 24 04/04/2012 1334   BILITOT 0.89 11/26/2012 1311   BILITOT 0.6 04/04/2012 1334       Impression: #1. Unprovoked bilateral lower extremity DVT and low volume bilateral pulmonary emboli occurring on 03/25/2012 Extensive evaluation to date fails to reveal a clear precipitating factor for this event. There is no evidence for occult malignancy. No abnormalities on routine hypercoagulation  panel. I remain intrigued by transient decrease in clotting factor VIII which, if anything, would predispose towards bleeding and not clotting. level has now normalized. Positive lupus anticoagulant not reproducible off anticoagulant drugs. Negative antibodies to beta 2 glycoprotein 1.  I had a lengthy discussion with him and his wife with respect to risk versus benefit of ongoing full dose anticoagulation. I think it is a judgment call. One could easily justify extending full dose anticoagulation balancing the benefits versus the risks. However, he has now been fully anticoagulated for one year. His d-dimer normalized. We have not identified any major risk towards rethrombosis. I feel it would be reasonable to stop his Xarelto and put him on aspirin 81 mg daily. This reduces risk of recurrence by about 30%. . In the event of any future embolic events, he will then be committed to long-term anticoagulation.  #2. Mild  leukocytosis with normal white count differential This still suggests possibility of a chronic, underlying, inflammatory state but we have no evidence for a collagen vascular disorder or malignancy at this time. Possibility of an early myeloproliferative disorder not excluded.  #3. 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  Over 45 minutes spent with this patient and his wife with 100% on counseling with review of data and advice on anticoagulation.    CC:.  Dr. Merri Brunette; Dr. Casimiro Needle; Dr. Fabienne Bruns   Levert Feinstein, MD 10/14/20141:27 PM

## 2013-04-16 ENCOUNTER — Other Ambulatory Visit: Payer: Self-pay | Admitting: Internal Medicine

## 2013-04-16 ENCOUNTER — Ambulatory Visit
Admission: RE | Admit: 2013-04-16 | Discharge: 2013-04-16 | Disposition: A | Discharge status: Home / Self Care | Payer: BC Managed Care – PPO | Source: Ambulatory Visit | Attending: Internal Medicine | Admitting: Internal Medicine

## 2013-04-16 DIAGNOSIS — R52 Pain, unspecified: Secondary | ICD-10-CM

## 2013-04-17 ENCOUNTER — Telehealth: Payer: Self-pay | Admitting: *Deleted

## 2013-04-17 NOTE — Telephone Encounter (Signed)
Pt's wife called reporting pt went off his Xarelto a week ago; had office visit with Dr. Cyndie Chime 10/13.  Monday 04/14/13 pt "started hurting in his legs like before when he had his clots"  Dr. Renne Crigler referred pt to [facility] for doppler and pt was "positive for blood clots in legs"  Pt's wife also states that he has an appt with Dr. Renne Crigler today but wanted Dr. Cyndie Chime to be aware of events.  Pt did start back take Xarelto 04/16/13.  Note to Dr. Cyndie Chime.

## 2013-05-01 ENCOUNTER — Other Ambulatory Visit: Payer: Self-pay

## 2013-06-11 ENCOUNTER — Telehealth: Payer: Self-pay | Admitting: *Deleted

## 2013-06-11 ENCOUNTER — Other Ambulatory Visit: Payer: Self-pay | Admitting: Oncology

## 2013-06-11 NOTE — Telephone Encounter (Signed)
Surescript refill request received for Xarelto for this patient.  Last office note on 04-07-2013 reads patient to discontinue Xarelto and to take one baby aspirin 81 mg daily.  For f/u on an as needed basis and if future clotting he will need long term anti-coagulation.  Called Pharmacy with this information.

## 2013-06-13 ENCOUNTER — Other Ambulatory Visit: Payer: Self-pay | Admitting: Oncology

## 2013-06-13 DIAGNOSIS — I2699 Other pulmonary embolism without acute cor pulmonale: Secondary | ICD-10-CM

## 2013-08-25 ENCOUNTER — Encounter: Payer: Self-pay | Admitting: Oncology

## 2014-01-14 IMAGING — CT CT ABD-PELV W/ CM
2 of 3 series · 17 of 46 positions shown, 19 images · IV contrast (water/omni  & 80ml omni 300)
Comparison: None.

CLINICAL DATA: Lung nodule and pulmonary embolus.  Evaluate for
malignancy.

CT ABDOMEN AND PELVIS WITH CONTRAST
TECHNIQUE: Multidetector CT imaging of the abdomen and pelvis was
performed following the standard protocol during bolus
administration of intravenous contrast.
Contrast: 100mL OMNIPAQUE IOHEXOL 300 MG/ML  SOLN

[Series 2: routine abdomen · axial · 0.97mm/px · z∈[-573,-103]mm · 14 of 110 slices shown, 16 images]
[im 8/110  soft-tissue]
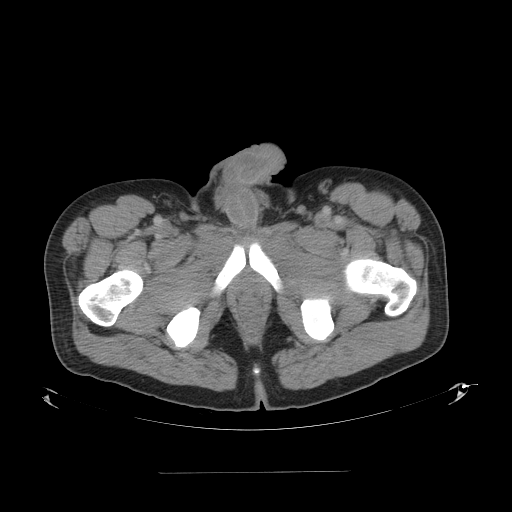
[im 8/110  bone]
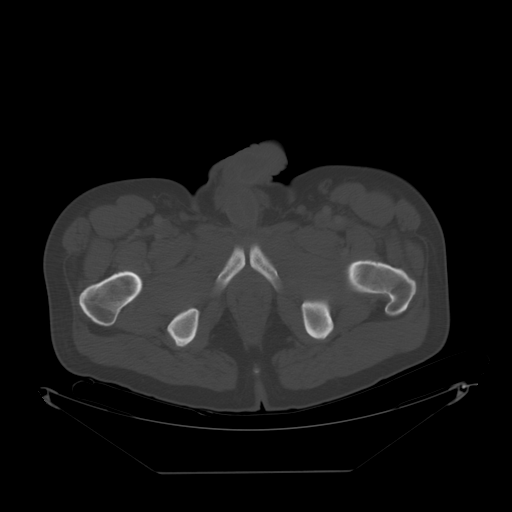
[im 15/110  soft-tissue]
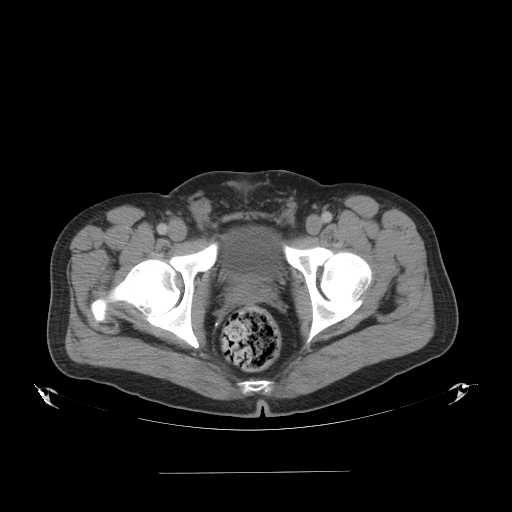
[im 22/110  soft-tissue]
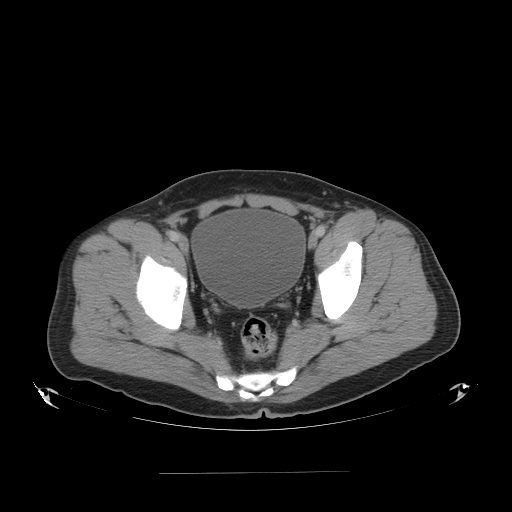
[im 29/110  soft-tissue]
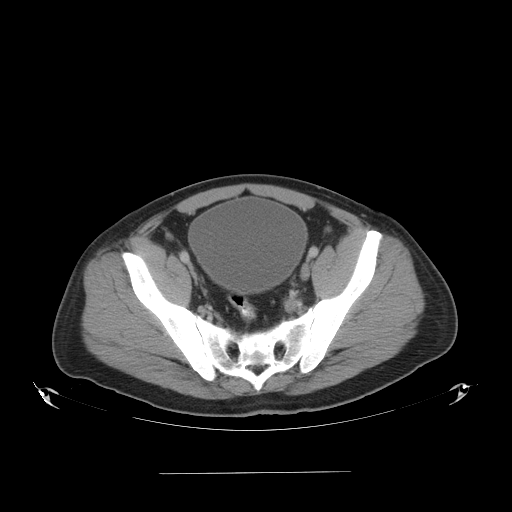
[im 36/110  soft-tissue]
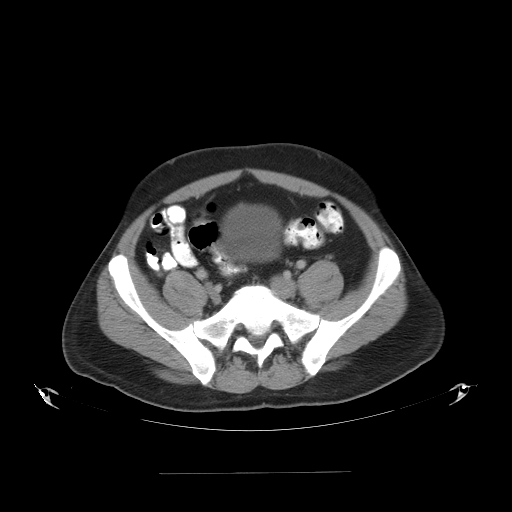
[im 43/110  soft-tissue]
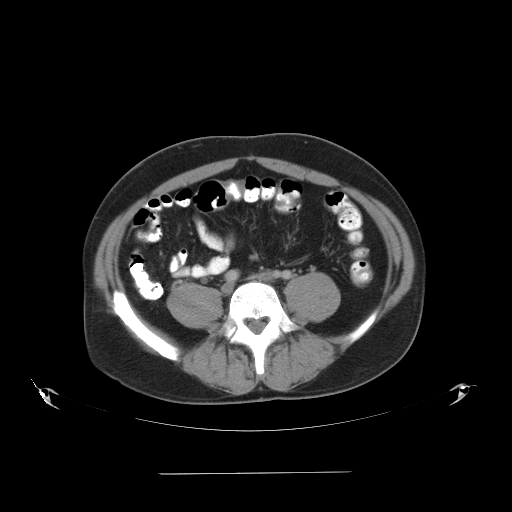
[im 50/110  soft-tissue]
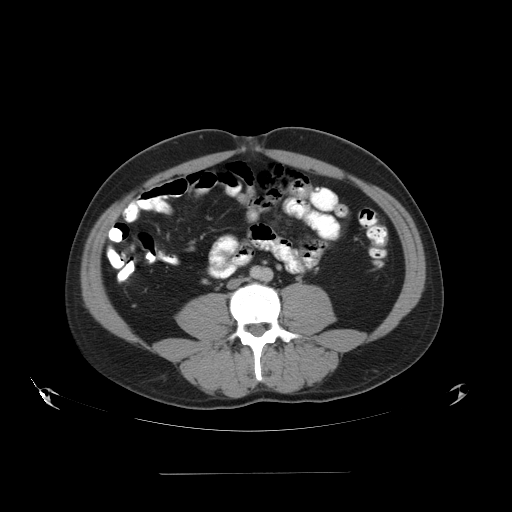
[im 60/110  soft-tissue]
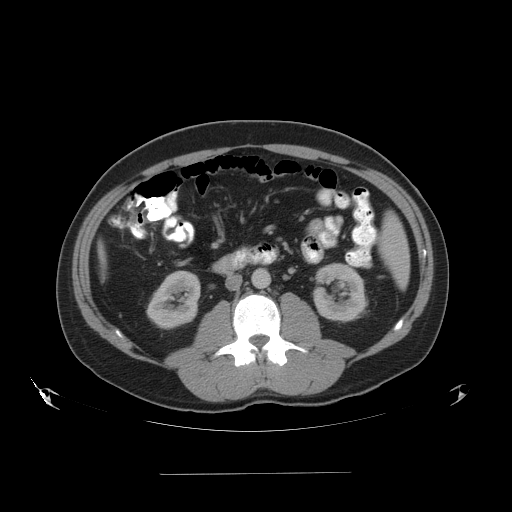
[im 67/110  soft-tissue]
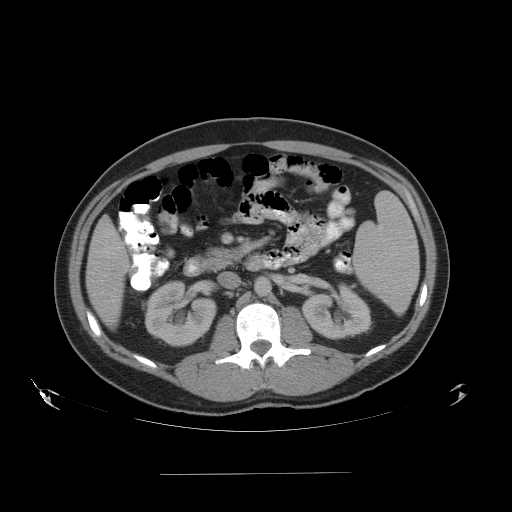
[im 67/110  bone]
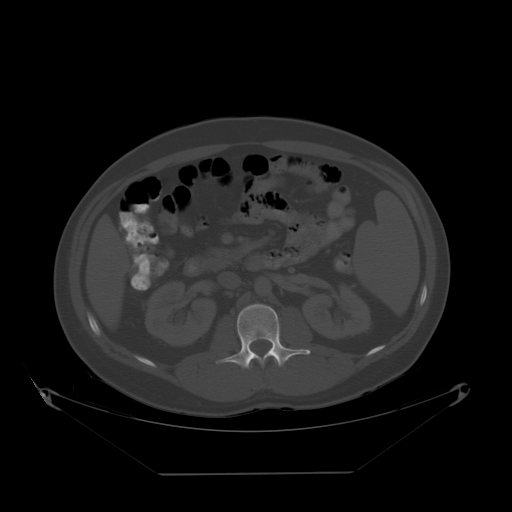
[im 74/110  soft-tissue]
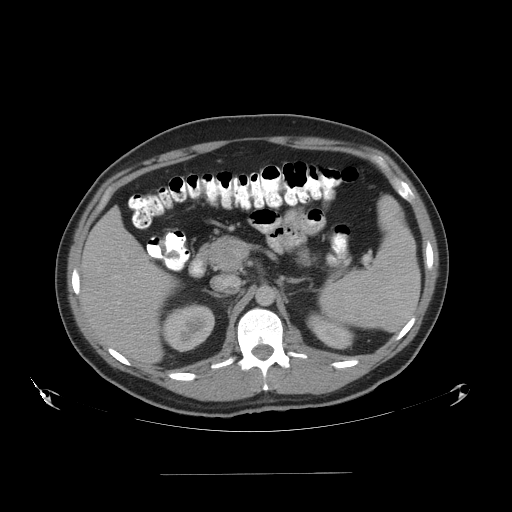
[im 81/110  soft-tissue]
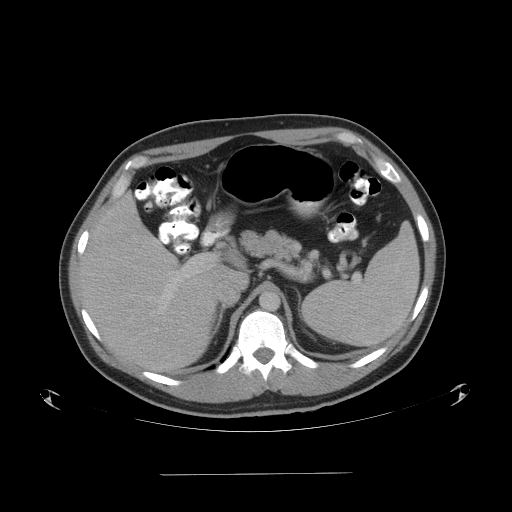
[im 88/110  soft-tissue]
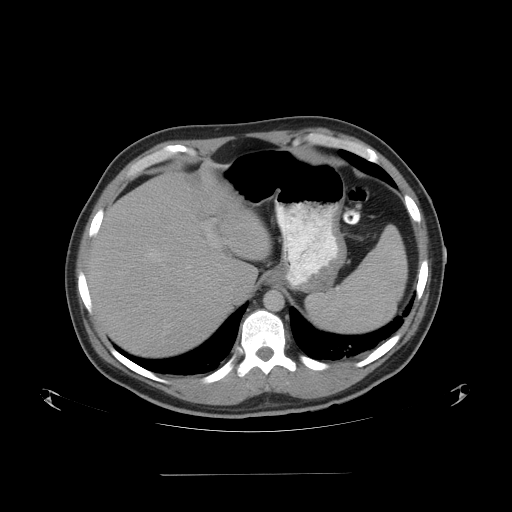
[im 95/110  soft-tissue]
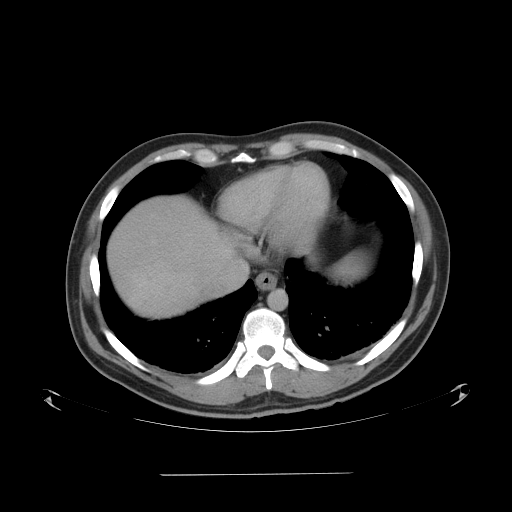
[im 102/110  soft-tissue]
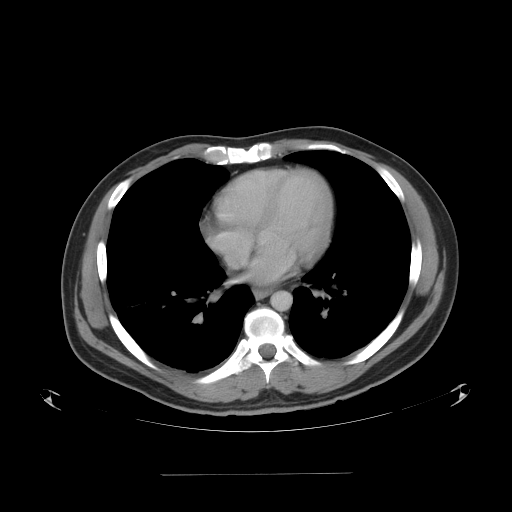

[Series 401: cor · coronal · 1.04mm/px · 3 of 115 slices shown]
[im 39/115  soft-tissue]
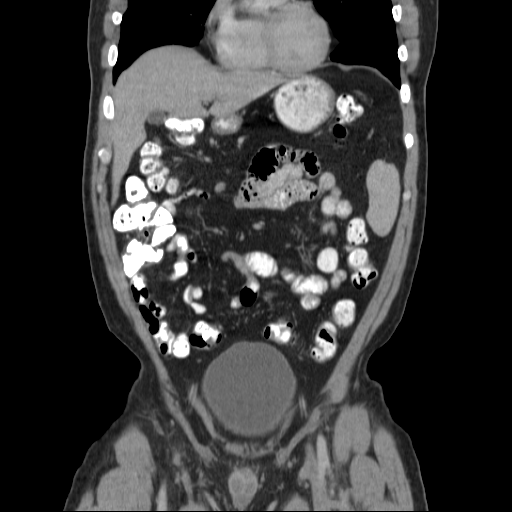
[im 51/115  soft-tissue]
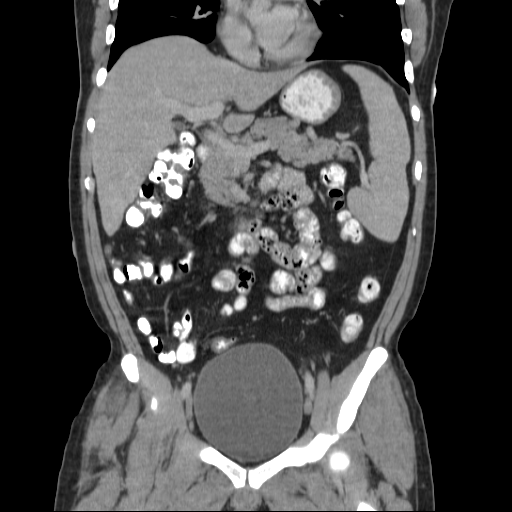
[im 64/115  soft-tissue]
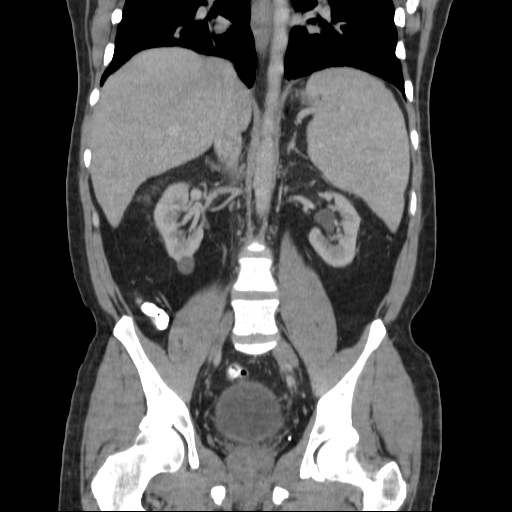

[17 of 46 positions shown; findings below may reference images not displayed]

FINDINGS: The liver measures 20.7 cm in cranial caudal length.
Liver parenchyma is heterogeneous.  The 7.3 cm area of increased
attenuation is identified in the posterior right hepatic dome.
This is probably related to some fatty sparing and appears to have
unopacified hepatic veins coursing through it.

The spleen is enlarged, measuring 18.4 cm in cranial caudal length.
The stomach, duodenum, pancreas, gallbladder, adrenal glands, and
kidneys are unremarkable.

No abdominal aortic aneurysm.  There is no lymphadenopathy in the
abdomen.  No free fluid.

Imaging through the pelvis shows a distended bladder.  No pelvic
sidewall lymphadenopathy.  No colonic diverticulitis.  Colon is
unremarkable by CT imaging.  The terminal ileum is normal.  The
appendix is normal.

Bone windows reveal no worrisome lytic or sclerotic osseous
lesions.
IMPRESSION: Hepatosplenomegaly.  Liver parenchyma is heterogeneous and there is
a 7 cm area of increased attenuation in the posterior hepatic dome.
This is most likely related to an area of fatty sparing.  MR
imaging could be used to confirm, as clinically warranted.

## 2014-05-01 ENCOUNTER — Other Ambulatory Visit: Payer: Self-pay | Admitting: Internal Medicine

## 2014-05-01 DIAGNOSIS — I82409 Acute embolism and thrombosis of unspecified deep veins of unspecified lower extremity: Secondary | ICD-10-CM

## 2014-05-04 ENCOUNTER — Other Ambulatory Visit: Payer: Self-pay | Admitting: Internal Medicine

## 2014-05-04 DIAGNOSIS — R911 Solitary pulmonary nodule: Secondary | ICD-10-CM

## 2014-06-11 ENCOUNTER — Ambulatory Visit
Admission: RE | Admit: 2014-06-11 | Discharge: 2014-06-11 | Disposition: A | Discharge status: Home / Self Care | Payer: BC Managed Care – PPO | Source: Ambulatory Visit | Attending: Internal Medicine | Admitting: Internal Medicine

## 2014-06-11 DIAGNOSIS — R911 Solitary pulmonary nodule: Secondary | ICD-10-CM

## 2014-06-11 DIAGNOSIS — I82409 Acute embolism and thrombosis of unspecified deep veins of unspecified lower extremity: Secondary | ICD-10-CM

## 2019-08-12 ENCOUNTER — Encounter (HOSPITAL_COMMUNITY): Payer: Self-pay

## 2019-08-12 ENCOUNTER — Other Ambulatory Visit: Payer: Self-pay

## 2019-08-12 ENCOUNTER — Emergency Department (HOSPITAL_COMMUNITY): Payer: Managed Care, Other (non HMO)

## 2019-08-12 ENCOUNTER — Emergency Department (HOSPITAL_COMMUNITY)
Admission: EM | Admit: 2019-08-12 | Discharge: 2019-08-12 | Disposition: A | Discharge status: Home / Self Care | Payer: Managed Care, Other (non HMO) | Attending: Emergency Medicine | Admitting: Emergency Medicine

## 2019-08-12 DIAGNOSIS — G510 Bell's palsy: Secondary | ICD-10-CM | POA: Diagnosis not present

## 2019-08-12 DIAGNOSIS — Z79899 Other long term (current) drug therapy: Secondary | ICD-10-CM | POA: Diagnosis not present

## 2019-08-12 DIAGNOSIS — F909 Attention-deficit hyperactivity disorder, unspecified type: Secondary | ICD-10-CM | POA: Insufficient documentation

## 2019-08-12 DIAGNOSIS — Z7901 Long term (current) use of anticoagulants: Secondary | ICD-10-CM | POA: Insufficient documentation

## 2019-08-12 DIAGNOSIS — R29810 Facial weakness: Secondary | ICD-10-CM | POA: Diagnosis present

## 2019-08-12 DIAGNOSIS — E039 Hypothyroidism, unspecified: Secondary | ICD-10-CM | POA: Insufficient documentation

## 2019-08-12 MED ORDER — PREDNISONE 20 MG PO TABS: 60.0000 mg | ORAL_TABLET | Freq: Every day | ORAL | 0 refills | Status: AC

## 2019-08-12 MED ORDER — VALACYCLOVIR HCL 1 G PO TABS: 1000.0000 mg | ORAL_TABLET | Freq: Three times a day (TID) | ORAL | 0 refills | Status: DC

## 2019-08-12 NOTE — ED Provider Notes (Signed)
[facility] [place] EMERGENCY DEPARTMENT Provider Note   CSN: [id number] Arrival date & time: 08/12/19  5093     History Chief Complaint  Patient presents with  . facial paralysis    Andrew Clayton is a 49 y.o. male.  49yo male with past medical history of PE, DVT, on Xarelto, also hypothyroid presents with complaint of right side facial weakness. Patient reports right eye irritation Sunday night, felt like his eye would not completely close, it was watery with right-sided rhinorrhea.  Patient reports yesterday feeling like the right side of his face had a change in sensation, noticed that his smile was asymmetric, noticed asymmetric movement of his eyebrows.  Patient denies difficulty walking or loss of grip strength however does report feeling altered sensation to his right leg which she has a difficult time describing or localizing. 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  No other complaints or concerns today.        Past Medical History:  Diagnosis Date  . ADHD (attention deficit hyperactivity disorder)   . DVT (deep venous thrombosis) ([place])   . Hypothyroidism     Patient Active Problem List   Diagnosis Date Noted  . Liver nodule 06/06/2012  . 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 Aneurysm of unspecified site ([place]) 04/25/2012  . Pain in limb 04/25/2012  . Superficial thrombophlebitis 03/26/2012  . Maculopapular rash, localized - extremities 03/26/2012  . Leukocytosis 03/26/2012  . PE (pulmonary embolism) 03/25/2012  . DVT (deep venous thrombosis) ([place]) 03/25/2012    Past Surgical History:  Procedure Laterality Date  . NO PAST SURGERIES    . rapid lysis  04/04/2012       Family History  Problem Relation Age of Onset  . Pulmonary embolism Neg Hx     Social History   Tobacco Use  . Smoking status: Never Smoker  . Smokeless  tobacco: Never Used  Substance Use Topics  . Alcohol use: No  . Drug use: No    Home Medications Prior to Admission medications   Medication Sig Start Date End Date Taking? Authorizing Provider  acetaminophen (TYLENOL) 500 MG tablet Take 1,000 mg by mouth every 6 (six) hours as needed. For pain    [provider]  amphetamine-dextroamphetamine (ADDERALL XR) 25 MG 24 hr capsule Take 50 mg by mouth every morning.     [provider]  clonazePAM (KLONOPIN) 0.5 MG tablet Take 0.5 mg by mouth at bedtime as needed. For stress    [provider]  HYDROcodone-acetaminophen (NORCO/VICODIN) 5-325 MG per tablet Take 1-2 tablets by mouth every 6 (six) hours as needed for pain. 04/25/12   Elam Dutch, MD  levothyroxine (SYNTHROID, LEVOTHROID) 75 MCG tablet Take 75 mcg by mouth daily before breakfast.    [provider]  Multiple Vitamin (MULTIVITAMIN) tablet Take 1 tablet by mouth daily.    [provider]  predniSONE (DELTASONE) 20 MG tablet Take 3 tablets (60 mg total) by mouth daily for 7 days. 08/12/19 08/19/19  Tacy Learn, PA-C  valACYclovir (VALTREX) 1000 MG tablet Take 1 tablet (1,000 mg total) by mouth 3 (three) times daily. 08/12/19   Tacy Learn, PA-C  XARELTO 20 MG TABS tablet TAKE 1 TABLET EVERY DAY 02/07/13   Annia Belt, MD    Allergies    Patient has no known allergies.  Review of Systems   Review  of Systems  Constitutional: Negative for chills and fever.  HENT: Positive for rhinorrhea. Negative for congestion.   Eyes: Positive for discharge.  Respiratory: Negative for shortness of breath.   Cardiovascular: Negative for chest pain.  Gastrointestinal: Negative for nausea and vomiting.  Musculoskeletal: Negative for neck pain and neck stiffness.  Skin: Negative for rash and wound.  Allergic/Immunologic: Negative for immunocompromised state.  Neurological: Positive for numbness. Negative for dizziness and headaches.    Psychiatric/Behavioral: Negative for confusion.  All other systems reviewed and are negative.   Physical Exam Updated Vital Signs BP (!) 133/100   Pulse 92   Temp 98.5 F (36.9 C) (Oral)   Resp 17   Ht 6\' 1"  (1.854 m)   Wt 108.9 kg   SpO2 97%   BMI 31.66 kg/m   Physical Exam Vitals and nursing note reviewed.  Constitutional:      General: He is not in acute distress.    Appearance: He is well-developed. He is not diaphoretic.  HENT:     Head: Normocephalic and atraumatic.     Left Ear: Tympanic membrane, ear canal and external ear normal. There is no impacted cerumen.     Mouth/Throat:     Mouth: Mucous membranes are moist.  Eyes:     General: No visual field deficit.       Right eye: No discharge.        Left eye: No discharge.     Extraocular Movements: Extraocular movements intact.     Pupils: Pupils are equal, round, and reactive to light.  Cardiovascular:     Rate and Rhythm: Normal rate and regular rhythm.     Pulses: Normal pulses.     Heart sounds: Normal heart sounds.  Pulmonary:     Effort: Pulmonary effort is normal.     Breath sounds: Normal breath sounds.  Musculoskeletal:        General: No swelling or tenderness.     Cervical back: Neck supple. No tenderness.     Right lower leg: No edema.     Left lower leg: No edema.  Skin:    General: Skin is warm and dry.     Findings: No erythema or rash.  Neurological:     Mental Status: He is alert and oriented to person, place, and time.     GCS: GCS eye subscore is 4. GCS verbal subscore is 5. GCS motor subscore is 6.     Cranial Nerves: Facial asymmetry present. No dysarthria.     Sensory: Sensation is intact. No sensory deficit.     Motor: Motor function is intact. No weakness or pronator drift.     Coordination: Heel to Shin Test normal.     Gait: Gait is intact.     Deep Tendon Reflexes:     Reflex Scores:      Patellar reflexes are 1+ on the right side and 1+ on the left side.    Comments:  Right side facial weakness including forehead, asymetric smile. Tongue midline.Sensation intact. No leg weakness, no appreciable changes in sensation although continues to state right leg feels different.  Psychiatric:        Behavior: Behavior normal.     ED Results / Procedures / Treatments   Labs (all labs ordered are listed, but only abnormal results are displayed) Labs Reviewed - No data to display  EKG None  Radiology MR BRAIN WO CONTRAST  Result Date: 08/12/2019 CLINICAL DATA:  Right-sided weakness EXAM: MRI  HEAD WITHOUT CONTRAST TECHNIQUE: Multiplanar, multiecho pulse sequences of the brain and surrounding structures were obtained without intravenous contrast. COMPARISON:  None. FINDINGS: Brain: There is no acute infarction or intracranial hemorrhage. There is no intracranial mass, mass effect, or edema. There is no hydrocephalus or extra-axial fluid collection. Small chronic cerebellar infarcts. Ventricles and sulci are within normal limits in size and configuration. Vascular: Major vessel flow voids at the skull base are preserved. Skull and upper cervical spine: Normal marrow signal is preserved. Sinuses/Orbits: Opacified hypoplastic right maxillary sinus. Mild ethmoid mucosal thickening orbits are unremarkable. Other: Sella is unremarkable.  Mastoid air cells are clear. IMPRESSION: No evidence of recent infarction, hemorrhage, or mass. Small chronic cerebellar infarcts. Electronically Signed   By: Guadlupe Spanish M.D.   On: 08/12/2019 12:42    Procedures Procedures (including critical care time)  Medications Ordered in ED Medications - No data to display  ED Course  I have reviewed the triage vital signs and the nursing notes.  Pertinent labs & imaging results that were available during my care of the patient were reviewed by me and considered in my medical decision making (see chart for details).  Clinical Course as of Aug 12 1423  Tue Aug 12, 2019  4810 49 year old male  presents with likely Bell's palsy with right side facial asymmetry however due to report of altered sensation in his right leg an MRI was ordered for further evaluation.  MRI is negative for acute findings, will treat his Bell's palsy with prednisone and Valtrex, advised to apply natural tears drops to the right eye, take right eye closed while sleeping, follow-up with ophthalmology and neurology.  Patient verbalizes understanding of discharge instructions and plan, will return to ER for new or worsening symptoms.   [LM]    Clinical Course User Index [LM] Alden Hipp   MDM Rules/Calculators/A&P                      Final Clinical Impression(s) / ED Diagnoses Final diagnoses:  Bell's palsy    Rx / DC Orders ED Discharge Orders         Ordered    valACYclovir (VALTREX) 1000 MG tablet  3 times daily     08/12/19 1320    predniSONE (DELTASONE) 20 MG tablet  Daily     08/12/19 1320           Alden Hipp 08/12/19 1425    Virgina Norfolk, DO 08/12/19 1636

## 2019-08-12 NOTE — Discharge Instructions (Signed)
Follow up with ophthalmology for monitoring your eye. You should use a paper tape to tape the eye closed at night.  Follow up with neurology, call to schedule an appointment. Use a preservative free natural tears eye drop during the day to help keep the eye moisturized.  Take Prednisone and Valtrex as prescribed.

## 2019-08-12 NOTE — ED Notes (Signed)
Pt arrived back from MRI.

## 2019-08-12 NOTE — ED Notes (Signed)
Patient transported to MRI 

## 2019-08-12 NOTE — ED Triage Notes (Addendum)
Patient complains of right sided facial numbness and had eye pain to right on Sunday night. No other neuro deficits, alert and oriented No drift, ambulatory with steady gait. Speech clear

## 2019-09-11 ENCOUNTER — Ambulatory Visit: Payer: Medicaid Other | Attending: Internal Medicine

## 2019-09-11 DIAGNOSIS — Z23 Encounter for immunization: Secondary | ICD-10-CM

## 2019-09-11 NOTE — Progress Notes (Signed)
   Covid-19 Vaccination Clinic  Name:  Andrew Clayton    MRN: [id number] DOB: [birth year]  09/11/2019  [name] was observed post Covid-19 immunization for 15 minutes without incident. He was provided with Vaccine Information Sheet and instruction to access the V-Safe system.   [name] was instructed to call 911 with any severe reactions post vaccine: Marland Kitchen Difficulty breathing  . Swelling of face and throat  . A fast heartbeat  . A bad rash all over body  . Dizziness and weakness   Immunizations Administered    Name Date Dose VIS Date Route   Pfizer COVID-19 Vaccine 09/11/2019 10:43 AM 0.3 mL 06/06/2019 Intramuscular   Manufacturer: [employer], [employer]   Lot: [id number]   NDC: [address]-7

## 2019-10-06 ENCOUNTER — Ambulatory Visit: Payer: Managed Care, Other (non HMO) | Attending: Internal Medicine

## 2019-10-06 DIAGNOSIS — Z23 Encounter for immunization: Secondary | ICD-10-CM

## 2019-10-06 NOTE — Progress Notes (Signed)
   Covid-19 Vaccination Clinic  Name:  Andrew Clayton    MRN: [id number] DOB: Apr 16, 1971  10/06/2019  [name] was observed post Covid-19 immunization for 15 minutes without incident. He was provided with Vaccine Information Sheet and instruction to access the V-Safe system.   [name] was instructed to call 911 with any severe reactions post vaccine: Marland Kitchen Difficulty breathing  . Swelling of face and throat  . A fast heartbeat  . A bad rash all over body  . Dizziness and weakness   Immunizations Administered    Name Date Dose VIS Date Route   Pfizer COVID-19 Vaccine 10/06/2019 10:06 AM 0.3 mL 06/06/2019 Intramuscular   Manufacturer: [employer], [employer]   Lot: [id number]   NDC: [address]-0

## 2020-07-06 ENCOUNTER — Other Ambulatory Visit: Payer: Managed Care, Other (non HMO)

## 2020-11-29 ENCOUNTER — Other Ambulatory Visit: Payer: Self-pay | Admitting: Internal Medicine

## 2020-11-29 DIAGNOSIS — I10 Essential (primary) hypertension: Secondary | ICD-10-CM

## 2021-01-11 ENCOUNTER — Ambulatory Visit
Admission: RE | Admit: 2021-01-11 | Discharge: 2021-01-11 | Disposition: A | Discharge status: Home / Self Care | Payer: No Typology Code available for payment source | Source: Ambulatory Visit | Attending: Internal Medicine | Admitting: Internal Medicine

## 2021-01-11 DIAGNOSIS — I10 Essential (primary) hypertension: Secondary | ICD-10-CM

## 2021-04-25 ENCOUNTER — Other Ambulatory Visit: Payer: Self-pay | Admitting: Orthopedic Surgery

## 2021-04-25 ENCOUNTER — Other Ambulatory Visit
Admission: RE | Admit: 2021-04-25 | Discharge: 2021-04-25 | Disposition: A | Discharge status: Home / Self Care | Payer: Managed Care, Other (non HMO) | Source: Ambulatory Visit | Attending: Orthopedic Surgery | Admitting: Orthopedic Surgery

## 2021-04-25 ENCOUNTER — Other Ambulatory Visit: Payer: Self-pay

## 2021-04-25 HISTORY — DX: Umbilical hernia without obstruction or gangrene: K42.9

## 2021-04-25 NOTE — Patient Instructions (Addendum)
Your procedure is scheduled on: Thursday, November 3 Report to the Registration Desk on the 1st floor of the [facility] To find out your arrival time, please call [phone] between 1PM - 3PM on: Wednesday, November 2  REMEMBER: Instructions that are not followed completely may result in serious medical risk, up to and including death; or upon the discretion of your surgeon and anesthesiologist your surgery may need to be rescheduled.  Do not eat food after midnight the night before surgery.  No gum chewing, lozengers or hard candies.  You may however, drink CLEAR liquids up to 2 hours before you are scheduled to arrive for your surgery. Do not drink anything within 2 hours of your scheduled arrival time.  Clear liquids include: - water  - apple juice without pulp - gatorade (not RED, PURPLE, OR BLUE) - black coffee or tea (Do NOT add milk or creamers to the coffee or tea) Do NOT drink anything that is not on this list.  TAKE THESE MEDICATIONS THE MORNING OF SURGERY WITH A SIP OF WATER:  Levothyroxine  Follow recommendations from Cardiologist, Pulmonologist or PCP regarding stopping Xarelto. Per secure chat with Dr. Martha Clan, stop Xarelto now. Last day to take is October 31. Resume AFTER surgery per surgeon instruction.   One week prior to surgery: Stop Anti-inflammatories (NSAIDS) such as Advil, Aleve, Ibuprofen, Motrin, Naproxen, Naprosyn and Aspirin based products such as Excedrin, Goodys Powder, BC Powder. Stop ANY OVER THE COUNTER supplements until after surgery. You may however, continue to take Tylenol if needed for pain up until the day of surgery.  No Alcohol for 24 hours before or after surgery.  No Smoking including e-cigarettes for 24 hours prior to surgery.  No chewable tobacco products for at least 6 hours prior to surgery.  No nicotine patches on the day of surgery.  Do not use any "recreational" drugs for at least a week prior to your surgery.  Please be  advised that the combination of cocaine and anesthesia may have negative outcomes, up to and including death. If you test positive for cocaine, your surgery will be cancelled.  On the morning of surgery brush your teeth with toothpaste and water, you may rinse your mouth with mouthwash if you wish. Do not swallow any toothpaste or mouthwash.  Shower using antibacterial soap prior to coming to the hospital on the day of surgery.  Do not wear jewelry, make-up, hairpins, clips or nail polish.  Do not wear lotions, powders, or perfumes.   Do not shave body from the neck down 48 hours prior to surgery just in case you cut yourself which could leave a site for infection.   Contact lenses, hearing aids and dentures may not be worn into surgery.  Do not bring valuables to the hospital. [facility] is not responsible for any missing/lost belongings or valuables.   Notify your doctor if there is any change in your medical condition (cold, fever, infection).  Wear comfortable clothing (specific to your surgery type) to the hospital.  After surgery, you can help prevent lung complications by doing breathing exercises.  Take deep breaths and cough every 1-2 hours. Your doctor may order a device called an Incentive Spirometer to help you take deep breaths.  If you are being discharged the day of surgery, you will not be allowed to drive home. You will need a responsible adult (18 years or older) to drive you home and stay with you that night.   If you are  taking public transportation, you will need to have a responsible adult (18 years or older) with you. Please confirm with your physician that it is acceptable to use public transportation.   Please call the Pre-admissions Testing Dept. at [phone] if you have any questions about these instructions.  Surgery Visitation Policy:  Patients undergoing a surgery or procedure may have one family member or support person with them as long as that  person is not COVID-19 positive or experiencing its symptoms.  That person may remain in the waiting area during the procedure and may rotate out with other people.

## 2021-04-28 ENCOUNTER — Ambulatory Visit
Admission: RE | Admit: 2021-04-28 | Discharge: 2021-04-28 | Disposition: A | Discharge status: Home / Self Care | Payer: Managed Care, Other (non HMO) | Attending: Orthopedic Surgery | Admitting: Orthopedic Surgery

## 2021-04-28 ENCOUNTER — Encounter: Payer: Self-pay | Admitting: Orthopedic Surgery

## 2021-04-28 ENCOUNTER — Ambulatory Visit: Payer: Managed Care, Other (non HMO)

## 2021-04-28 ENCOUNTER — Encounter
Admission: RE | Disposition: A | Discharge status: Home / Self Care | Payer: Self-pay | Source: Home / Self Care | Attending: Orthopedic Surgery

## 2021-04-28 ENCOUNTER — Ambulatory Visit: Payer: Managed Care, Other (non HMO) | Admitting: Registered Nurse

## 2021-04-28 ENCOUNTER — Other Ambulatory Visit: Payer: Self-pay

## 2021-04-28 DIAGNOSIS — S83511A Sprain of anterior cruciate ligament of right knee, initial encounter: Secondary | ICD-10-CM | POA: Insufficient documentation

## 2021-04-28 DIAGNOSIS — M23221 Derangement of posterior horn of medial meniscus due to old tear or injury, right knee: Secondary | ICD-10-CM | POA: Insufficient documentation

## 2021-04-28 DIAGNOSIS — X58XXXA Exposure to other specified factors, initial encounter: Secondary | ICD-10-CM | POA: Insufficient documentation

## 2021-04-28 HISTORY — PX: KNEE ARTHROSCOPY WITH ANTERIOR CRUCIATE LIGAMENT (ACL) REPAIR WITH HAMSTRING GRAFT: SHX5645

## 2021-04-28 SURGERY — KNEE ARTHROSCOPY WITH ANTERIOR CRUCIATE LIGAMENT (ACL) REPAIR WITH HAMSTRING GRAFT
Anesthesia: General | Site: Knee | Laterality: Right

## 2021-04-28 MED ORDER — EPHEDRINE 5 MG/ML INJ
INTRAVENOUS | Status: AC
  Filled 2021-04-28: qty 5

## 2021-04-28 MED ORDER — ROCURONIUM BROMIDE 10 MG/ML (PF) SYRINGE
PREFILLED_SYRINGE | INTRAVENOUS | Status: AC
  Filled 2021-04-28: qty 10

## 2021-04-28 MED ORDER — LACTATED RINGERS IV SOLN: INTRAVENOUS | Status: DC

## 2021-04-28 MED ORDER — CEFAZOLIN SODIUM-DEXTROSE 2-4 GM/100ML-% IV SOLN
2.0000 g | INTRAVENOUS | Status: AC
  Administered 2021-04-28: 2 g via INTRAVENOUS

## 2021-04-28 MED ORDER — OXYCODONE HCL 5 MG PO TABS: 5.0000 mg | ORAL_TABLET | ORAL | 0 refills | Status: DC | PRN

## 2021-04-28 MED ORDER — LIDOCAINE HCL 1 % IJ SOLN
INTRAMUSCULAR | Status: DC | PRN
  Administered 2021-04-28: 3 mL

## 2021-04-28 MED ORDER — EPINEPHRINE PF 1 MG/ML IJ SOLN
INTRAMUSCULAR | Status: AC
  Filled 2021-04-28: qty 4

## 2021-04-28 MED ORDER — FAMOTIDINE 20 MG PO TABS: 20.0000 mg | ORAL_TABLET | Freq: Once | ORAL | Status: AC

## 2021-04-28 MED ORDER — ACETAMINOPHEN 500 MG PO TABS
1000.0000 mg | ORAL_TABLET | ORAL | Status: AC
  Administered 2021-04-28: 1000 mg via ORAL

## 2021-04-28 MED ORDER — EPHEDRINE SULFATE 50 MG/ML IJ SOLN
INTRAMUSCULAR | Status: DC | PRN
  Administered 2021-04-28 (×4): 5 mg via INTRAVENOUS

## 2021-04-28 MED ORDER — ORAL CARE MOUTH RINSE: 15.0000 mL | Freq: Once | OROMUCOSAL | Status: AC

## 2021-04-28 MED ORDER — DEXAMETHASONE SODIUM PHOSPHATE 10 MG/ML IJ SOLN
INTRAMUSCULAR | Status: DC | PRN
  Administered 2021-04-28: 10 mg via INTRAVENOUS

## 2021-04-28 MED ORDER — PHENYLEPHRINE HCL-NACL 20-0.9 MG/250ML-% IV SOLN
INTRAVENOUS | Status: AC
  Filled 2021-04-28: qty 250

## 2021-04-28 MED ORDER — PHENYLEPHRINE HCL (PRESSORS) 10 MG/ML IV SOLN
INTRAVENOUS | Status: DC | PRN
  Administered 2021-04-28: 80 ug via INTRAVENOUS
  Administered 2021-04-28: 160 ug via INTRAVENOUS
  Administered 2021-04-28: 80 ug via INTRAVENOUS

## 2021-04-28 MED ORDER — ONDANSETRON HCL 4 MG/2ML IJ SOLN
INTRAMUSCULAR | Status: AC
  Filled 2021-04-28: qty 2

## 2021-04-28 MED ORDER — FAMOTIDINE 20 MG PO TABS
ORAL_TABLET | ORAL | Status: AC
  Administered 2021-04-28: 20 mg via ORAL
  Filled 2021-04-28: qty 1

## 2021-04-28 MED ORDER — PHENYLEPHRINE HCL-NACL 20-0.9 MG/250ML-% IV SOLN
INTRAVENOUS | Status: DC | PRN
  Administered 2021-04-28: 40 ug/min via INTRAVENOUS

## 2021-04-28 MED ORDER — DEXMEDETOMIDINE (PRECEDEX) IN NS 20 MCG/5ML (4 MCG/ML) IV SYRINGE
PREFILLED_SYRINGE | INTRAVENOUS | Status: DC | PRN
  Administered 2021-04-28: 8 ug via INTRAVENOUS

## 2021-04-28 MED ORDER — LIDOCAINE HCL (PF) 1 % IJ SOLN
INTRAMUSCULAR | Status: AC
  Filled 2021-04-28: qty 30

## 2021-04-28 MED ORDER — ONDANSETRON HCL 4 MG/2ML IJ SOLN: 4.0000 mg | Freq: Once | INTRAMUSCULAR | Status: DC | PRN

## 2021-04-28 MED ORDER — PROPOFOL 10 MG/ML IV BOLUS
INTRAVENOUS | Status: AC
  Filled 2021-04-28: qty 20

## 2021-04-28 MED ORDER — CHLORHEXIDINE GLUCONATE CLOTH 2 % EX PADS: 6.0000 | MEDICATED_PAD | Freq: Once | CUTANEOUS | Status: DC

## 2021-04-28 MED ORDER — NEOMYCIN-POLYMYXIN B GU 40-200000 IR SOLN
Status: AC
  Filled 2021-04-28: qty 4

## 2021-04-28 MED ORDER — CHLORHEXIDINE GLUCONATE 0.12 % MT SOLN
OROMUCOSAL | Status: AC
  Administered 2021-04-28: 15 mL via OROMUCOSAL
  Filled 2021-04-28: qty 15

## 2021-04-28 MED ORDER — RINGERS IRRIGATION IR SOLN
Status: DC | PRN
  Administered 2021-04-28: 6000 mL
  Administered 2021-04-28: 12000 mL

## 2021-04-28 MED ORDER — ONDANSETRON HCL 4 MG PO TABS: 4.0000 mg | ORAL_TABLET | Freq: Three times a day (TID) | ORAL | 0 refills | Status: DC | PRN

## 2021-04-28 MED ORDER — DEXAMETHASONE SODIUM PHOSPHATE 10 MG/ML IJ SOLN
INTRAMUSCULAR | Status: AC
  Filled 2021-04-28: qty 1

## 2021-04-28 MED ORDER — 0.9 % SODIUM CHLORIDE (POUR BTL) OPTIME
TOPICAL | Status: DC | PRN
  Administered 2021-04-28: 1000 mL

## 2021-04-28 MED ORDER — BUPIVACAINE HCL (PF) 0.25 % IJ SOLN
INTRAMUSCULAR | Status: DC | PRN
  Administered 2021-04-28: 30 mL via INTRA_ARTICULAR

## 2021-04-28 MED ORDER — LIDOCAINE HCL (PF) 2 % IJ SOLN
INTRAMUSCULAR | Status: AC
  Filled 2021-04-28: qty 5

## 2021-04-28 MED ORDER — ROCURONIUM BROMIDE 100 MG/10ML IV SOLN
INTRAVENOUS | Status: DC | PRN
  Administered 2021-04-28: 50 mg via INTRAVENOUS
  Administered 2021-04-28: 30 mg via INTRAVENOUS
  Administered 2021-04-28: 20 mg via INTRAVENOUS
  Administered 2021-04-28: 30 mg via INTRAVENOUS

## 2021-04-28 MED ORDER — ACETAMINOPHEN 500 MG PO TABS
ORAL_TABLET | ORAL | Status: AC
  Filled 2021-04-28: qty 2

## 2021-04-28 MED ORDER — OXYCODONE HCL 5 MG PO TABS
5.0000 mg | ORAL_TABLET | Freq: Once | ORAL | Status: AC | PRN
  Administered 2021-04-28: 5 mg via ORAL

## 2021-04-28 MED ORDER — MIDAZOLAM HCL 2 MG/2ML IJ SOLN
INTRAMUSCULAR | Status: DC | PRN
  Administered 2021-04-28: 2 mg via INTRAVENOUS

## 2021-04-28 MED ORDER — CEFAZOLIN SODIUM-DEXTROSE 2-4 GM/100ML-% IV SOLN
INTRAVENOUS | Status: AC
  Filled 2021-04-28: qty 100

## 2021-04-28 MED ORDER — ONDANSETRON HCL 4 MG/2ML IJ SOLN
INTRAMUSCULAR | Status: DC | PRN
  Administered 2021-04-28: 4 mg via INTRAVENOUS

## 2021-04-28 MED ORDER — NEOMYCIN-POLYMYXIN B GU 40-200000 IR SOLN
Status: DC | PRN
  Administered 2021-04-28: 4 mL

## 2021-04-28 MED ORDER — FENTANYL CITRATE (PF) 100 MCG/2ML IJ SOLN
INTRAMUSCULAR | Status: AC
  Filled 2021-04-28: qty 2

## 2021-04-28 MED ORDER — FENTANYL CITRATE (PF) 100 MCG/2ML IJ SOLN: 25.0000 ug | INTRAMUSCULAR | Status: DC | PRN

## 2021-04-28 MED ORDER — CHLORHEXIDINE GLUCONATE 0.12 % MT SOLN: 15.0000 mL | Freq: Once | OROMUCOSAL | Status: AC

## 2021-04-28 MED ORDER — LIDOCAINE HCL (CARDIAC) PF 100 MG/5ML IV SOSY
PREFILLED_SYRINGE | INTRAVENOUS | Status: DC | PRN
  Administered 2021-04-28: 80 mg via INTRAVENOUS

## 2021-04-28 MED ORDER — BUPIVACAINE HCL (PF) 0.5 % IJ SOLN
INTRAMUSCULAR | Status: AC
  Filled 2021-04-28: qty 30

## 2021-04-28 MED ORDER — DEXMEDETOMIDINE (PRECEDEX) IN NS 20 MCG/5ML (4 MCG/ML) IV SYRINGE
PREFILLED_SYRINGE | INTRAVENOUS | Status: AC
  Filled 2021-04-28: qty 5

## 2021-04-28 MED ORDER — MIDAZOLAM HCL 2 MG/2ML IJ SOLN
INTRAMUSCULAR | Status: AC
  Filled 2021-04-28: qty 2

## 2021-04-28 MED ORDER — EPINEPHRINE PF 1 MG/ML IJ SOLN
INTRAMUSCULAR | Status: DC | PRN
  Administered 2021-04-28: 4 mL

## 2021-04-28 MED ORDER — SUGAMMADEX SODIUM 200 MG/2ML IV SOLN
INTRAVENOUS | Status: DC | PRN
  Administered 2021-04-28: 200 mg via INTRAVENOUS

## 2021-04-28 MED ORDER — PROPOFOL 10 MG/ML IV BOLUS
INTRAVENOUS | Status: DC | PRN
  Administered 2021-04-28: 200 mg via INTRAVENOUS

## 2021-04-28 MED ORDER — OXYCODONE HCL 5 MG/5ML PO SOLN: 5.0000 mg | Freq: Once | ORAL | Status: AC | PRN

## 2021-04-28 MED ORDER — OXYCODONE HCL 5 MG PO TABS
ORAL_TABLET | ORAL | Status: AC
  Filled 2021-04-28: qty 1

## 2021-04-28 MED ORDER — FENTANYL CITRATE (PF) 100 MCG/2ML IJ SOLN
INTRAMUSCULAR | Status: DC | PRN
  Administered 2021-04-28 (×2): 50 ug via INTRAVENOUS

## 2021-04-28 SURGICAL SUPPLY — 104 items
ADAPTER IRRIG TUBE 2 SPIKE SOL (ADAPTER) IMPLANT
ADPR TBG 2 SPK PMP STRL ASCP (ADAPTER)
ANCH SUT SWLK 19.1X4.75 VT (Anchor) ×1 IMPLANT
ANCHOR BUTTON TIGHTROPE ACL RT (Orthopedic Implant) ×2 IMPLANT
ANCHOR PEEK 4.75X19.1 SWLK C (Anchor) ×2 IMPLANT
ANCHOR SUPER #2 ORTHOCORD (Anchor) IMPLANT
BASIN GRAD PLASTIC 32OZ STRL (MISCELLANEOUS) ×2 IMPLANT
BIT DRILL PIN RETRO (DRILL) ×1 IMPLANT
BLADE FULL RADIUS 3.5 (BLADE) IMPLANT
BLADE SHAVER 4.5X7 STR FR (MISCELLANEOUS) ×2 IMPLANT
BLADE SURG 15 STRL LF DISP TIS (BLADE) ×1 IMPLANT
BLADE SURG 15 STRL SS (BLADE) ×2
BLADE SURG SZ11 CARB STEEL (BLADE) ×2 IMPLANT
BNDG COHESIVE 4X5 TAN ST LF (GAUZE/BANDAGES/DRESSINGS) ×2 IMPLANT
BNDG COHESIVE 6X5 TAN ST LF (GAUZE/BANDAGES/DRESSINGS) ×2 IMPLANT
BNDG ESMARK 6X12 TAN STRL LF (GAUZE/BANDAGES/DRESSINGS) ×2 IMPLANT
BUR BR 5.5 WIDE MOUTH (BURR) IMPLANT
CLEANER CAUTERY TIP 5X5 PAD (MISCELLANEOUS) IMPLANT
COOLER POLAR GLACIER W/PUMP (MISCELLANEOUS) ×2 IMPLANT
COVER BACK TABLE REUSABLE LG (DRAPES) ×2 IMPLANT
CUFF TOURN SGL QUICK 24 (TOURNIQUET CUFF)
CUFF TOURN SGL QUICK 34 (TOURNIQUET CUFF) ×2
CUFF TRNQT CYL 24X4X16.5-23 (TOURNIQUET CUFF) IMPLANT
CUFF TRNQT CYL 34X4.125X (TOURNIQUET CUFF) ×1 IMPLANT
CUTTER REFRO DUAL 10MM (CUTTER) ×2 IMPLANT
DRAPE 3/4 80X56 (DRAPES) ×4 IMPLANT
DRAPE ARTHRO LIMB 89X125 STRL (DRAPES) ×2 IMPLANT
DRAPE FLUOR MINI C-ARM 54X84 (DRAPES) ×2 IMPLANT
DRAPE IMP U-DRAPE 54X76 (DRAPES) ×2 IMPLANT
DRAPE INCISE IOBAN 66X45 STRL (DRAPES) IMPLANT
DRAPE POUCH INSTRU U-SHP 10X18 (DRAPES) ×2 IMPLANT
DRAPE U-SHAPE 47X51 STRL (DRAPES) ×2 IMPLANT
DRILL FLIPCUTTER III 6-12 (ORTHOPEDIC DISPOSABLE SUPPLIES) ×1 IMPLANT
DRILL PIN RETRO (DRILL) ×2
DURAPREP 26ML APPLICATOR (WOUND CARE) ×4 IMPLANT
ELECT REM PT RETURN 9FT ADLT (ELECTROSURGICAL) ×2
ELECTRODE REM PT RTRN 9FT ADLT (ELECTROSURGICAL) ×1 IMPLANT
FASTFIX NDL DEL SYS 360 CVD (Miscellaneous) ×4 IMPLANT
FLIPCUTTER III 6-12 AR-1204FF (ORTHOPEDIC DISPOSABLE SUPPLIES) ×2
GAUZE SPONGE 4X4 12PLY STRL (GAUZE/BANDAGES/DRESSINGS) ×2 IMPLANT
GAUZE XEROFORM 1X8 LF (GAUZE/BANDAGES/DRESSINGS) IMPLANT
GLOVE SURG ORTHO LTX SZ9 (GLOVE) ×4 IMPLANT
GLOVE SURG UNDER POLY LF SZ9 (GLOVE) ×2 IMPLANT
GOWN STRL REUS TWL 2XL XL LVL4 (GOWN DISPOSABLE) ×2 IMPLANT
GOWN STRL REUS W/ TWL LRG LVL3 (GOWN DISPOSABLE) ×1 IMPLANT
GOWN STRL REUS W/TWL LRG LVL3 (GOWN DISPOSABLE) ×2
GRADUATE 1200CC STRL 31836 (MISCELLANEOUS) ×2 IMPLANT
GUIDEWIRE 1.2MMX18 (WIRE) ×2 IMPLANT
HANDLE YANKAUER SUCT BULB TIP (MISCELLANEOUS) ×2 IMPLANT
IMP SYS 2ND FIX PEEK 4.75X19.1 (Miscellaneous) ×2 IMPLANT
IMPL SYS 2ND FX PEEK 4.75X19.1 (Miscellaneous) ×1 IMPLANT
IV LACTATED RINGER IRRG 3000ML (IV SOLUTION) ×14
IV LR IRRIG 3000ML ARTHROMATIC (IV SOLUTION) ×7 IMPLANT
KIT TRANSTIBIAL (DISPOSABLE) ×2 IMPLANT
KIT TURNOVER KIT A (KITS) ×2 IMPLANT
LABEL OR SOLS (LABEL) IMPLANT
MANIFOLD NEPTUNE II (INSTRUMENTS) ×2 IMPLANT
MAT ABSORB  FLUID 56X50 GRAY (MISCELLANEOUS) ×2
MAT ABSORB FLUID 56X50 GRAY (MISCELLANEOUS) ×2 IMPLANT
NDL SAFETY ECLIPSE 18X1.5 (NEEDLE) ×1 IMPLANT
NEEDLE FILTER BLUNT 18X 1/2SAF (NEEDLE) ×1
NEEDLE FILTER BLUNT 18X1 1/2 (NEEDLE) ×1 IMPLANT
NEEDLE HYPO 18GX1.5 SHARP (NEEDLE) ×2
NEEDLE HYPO 22GX1.5 SAFETY (NEEDLE) ×2 IMPLANT
NEEDLE MAYO 6 CRC TAPER PT (NEEDLE) IMPLANT
PACK ARTHROSCOPY KNEE (MISCELLANEOUS) ×2 IMPLANT
PAD ABD DERMACEA PRESS 5X9 (GAUZE/BANDAGES/DRESSINGS) ×4 IMPLANT
PAD CLEANER CAUTERY TIP 5X5 (MISCELLANEOUS)
PAD WRAPON POLAR KNEE (MISCELLANEOUS) ×1 IMPLANT
PENCIL ELECTRO HAND CTR (MISCELLANEOUS) ×2 IMPLANT
PUSHER KNOT ARTHRO STRT FASTFI (MISCELLANEOUS) ×2 IMPLANT
SCREW BIO FULL THREADED 10X28 (Screw) ×2 IMPLANT
SHAVER BLADE BONE CUTTER  5.5 (BLADE)
SHAVER BLADE BONE CUTTER 4.5 (BLADE) IMPLANT
SHAVER BLADE BONE CUTTER 5.5 (BLADE) IMPLANT
SPONGE T-LAP 18X18 ~~LOC~~+RFID (SPONGE) ×2 IMPLANT
STRIP CLOSURE SKIN 1/2X4 (GAUZE/BANDAGES/DRESSINGS) IMPLANT
SUCTION FRAZIER HANDLE 10FR (MISCELLANEOUS) ×1
SUCTION TUBE FRAZIER 10FR DISP (MISCELLANEOUS) ×1 IMPLANT
SUT 2 FIBERLOOP 20 STRT BLUE (SUTURE) ×4
SUT ETHILON 4-0 (SUTURE) ×2
SUT ETHILON 4-0 FS2 18XMFL BLK (SUTURE) ×1
SUT FIBERSNARE 2 CLSD LOOP (SUTURE) ×2 IMPLANT
SUT FIBERWIRE #2 38 T-5 BLUE (SUTURE) ×4
SUT MNCRL AB 4-0 PS2 18 (SUTURE) ×2 IMPLANT
SUT ORTHOCORD 2X36 W/O NDL (SUTURE) IMPLANT
SUT VIC AB 0 CT1 36 (SUTURE) ×2 IMPLANT
SUT VIC AB 2-0 CT2 27 (SUTURE) IMPLANT
SUT VIC AB 2-0 SH 27 (SUTURE) ×2
SUT VIC AB 2-0 SH 27XBRD (SUTURE) ×1 IMPLANT
SUTURE 2 FIBERLOOP 20 STRT BLU (SUTURE) ×2 IMPLANT
SUTURE ETHLN 4-0 FS2 18XMF BLK (SUTURE) ×1 IMPLANT
SUTURE FIBERWR #2 38 T-5 BLUE (SUTURE) ×2 IMPLANT
SYR 10ML LL (SYRINGE) ×4 IMPLANT
SYR BULB IRRIG 60ML STRL (SYRINGE) ×2 IMPLANT
SYSTEM NDL DEL FSTFX  360 CVD (Miscellaneous) ×2 IMPLANT
TAPE UMBILICAL 1/8 X36 TWILL (MISCELLANEOUS) ×2 IMPLANT
TOWEL OR 17X26 4PK STRL BLUE (TOWEL DISPOSABLE) ×4 IMPLANT
TUBING CONNECTING 10 (TUBING) IMPLANT
TUBING INFLOW SET DBFLO PUMP (TUBING) ×2 IMPLANT
TUBING OUTFLOW SET DBLFO PUMP (TUBING) ×2 IMPLANT
WAND WEREWOLF FLOW 90D (MISCELLANEOUS) ×2 IMPLANT
WATER STERILE IRR 500ML POUR (IV SOLUTION) IMPLANT
WRAPON POLAR PAD KNEE (MISCELLANEOUS) ×2

## 2021-04-28 NOTE — Transfer of Care (Signed)
Immediate Anesthesia Transfer of Care Note  Patient: Andrew Clayton  Procedure(s) Performed: KNEE ARTHROSCOPY WITH ANTERIOR CRUCIATE LIGAMENT (ACL) REPAIR WITH HAMSTRING GRAFT (Right: Knee)  Patient Location: PACU  Anesthesia Type:General  Level of Consciousness: awake, drowsy and patient cooperative  Airway & Oxygen Therapy: Patient Spontanous Breathing and Patient connected to face mask oxygen  Post-op Assessment: Report given to RN  Post vital signs: Reviewed and stable  Last Vitals:  Vitals Value Taken Time  BP 121/83 04/28/21 1110  Temp    Pulse 86 04/28/21 1113  Resp 17 04/28/21 1113  SpO2 97 % 04/28/21 1113  Vitals shown include unvalidated device data.  Last Pain:  Vitals:   04/28/21 0703  TempSrc: Oral  PainSc: 0-No pain         Complications: No notable events documented.

## 2021-04-28 NOTE — Op Note (Addendum)
04/28/2021  12:30 PM  PATIENT:  Andrew Clayton    PRE-OPERATIVE DIAGNOSIS:  Right Knee anterior cruciate ligament tear  POST-OPERATIVE DIAGNOSIS: Right knee anterior cruciate ligament tear and medial meniscus tear  PROCEDURE:  RIGHT KNEE ARTHROSCOPIC MEDIAL MENISCUS REPAIR WITH ANTERIOR CRUCIATE LIGAMENT RECONSTRUCTION WITH HAMSTRING AUTOGRAFT  SURGEON:  Juanell Fairly, MD  ASSISTANT:  Cam Hai, PA  ANESTHESIA:   General  PREOPERATIVE INDICATIONS:  Andrew Clayton is a  50 y.o. male with a diagnosis of Right Knee ACL Tear who failed conservative measures and elected for surgical management.    The risks benefits and alternatives were discussed with the patient preoperatively including but not limited to the risks of infection, bleeding, nerve or blood vessel injury, knee stiffness/arthrofibrosis, hardware failure, re-tear of the anterior cruciate ligament graft, persistent pain or instability, osteoarthritis and the need for revision surgery.  Medical risks include but are not limited to DVT and pulmonary embolism, stroke, pneumonia, respiratory failure and death. Patient understood these risks and wished to proceed with surgical reconstruction.   OPERATIVE IMPLANTS: Arthrex anterior cruciate ligament tightrope RC, Arthrex biocomposite 10 x 28 mm tibial interference screw and Arthrex swivel lock anchors x2.  [employer] FasTFix [phone] for meniscal repair  OPERATIVE FINDINGS: Undersurface vertical tear of the posterior horn of the medial meniscus, ACL tear from its femoral origin  OPERATIVE PROCEDURE: Patient was in the preoperative area.  Preop history and physical was performed at the bedside this morning.  The right knee was marked with the word yes according the hospital's correct site of surgery protocol. The patient was brought to the operating room and placed in the supine position. General anesthesia was administered.  Patient was given 2 g of Ancef IV  for antibiotic prophylaxis. The  lower extremity was prepped and draped in usual sterile fashion.   A time out was performed to verify the patient's name, date of birth, medical record number, correct site of surgery correct procedure to be performed. It was also used to verify the patient received antibiotics and all appropriate instruments, implants and radiographs studies were available in the room. Once all in attendance were in agreement case began. A tourniquet was applied to the right upper thigh but was not inflated.  Exam under anesthesia was performed which demonstrated increased anterior laxity on Lachman's test of approximately 10 mm.  The patient had no instability to varus and valgus stress testing at 0 and 30 of flexion. His knee range of motion was from 0 120. He did not have a significant effusion.   Proposed arthroscopy incisions were drawn out with a surgical marker and pre-injected with 1% lidocaine plain. An 11 blade was used to establish an inferior medial and lateral portals. The medial portal was created under direct visualization using an 18-gauge spinal needle for localization. A full diagnostic examination of the knee was performed including the suprapatellar pouch, the patella femoral joint, medial lateral gutters, the medial and lateral compartments, the intercondylar notch in the posterior knee.   The patient was found to have an undersurface vertical tear involving the posterior horn of the medial meniscus.  Two [employer] FasT-Fix 360 meniscal implants were used to repair the meniscal tear.  Once repaired the tear was stable to probing.  The anterior cruciate ligament fibers were then debrided with a Dyonics flyer shaver blade and 90 degree wand. A 4.5 Dyonics bone cutter shaver blade was used perform a notchplasty. Once the intercondylar notch was prepped the attention  was turned to harvesting the hamstring autografts.  A longitudinal incision was made over the anteromedial proximal tibia. The  sartorius fascia was incised with a 15 blade and reflected to reveal the underlying gracilis and semitendinosus. These were harvested using a tendon stripper. They're prepared on the back table. The graft was measured to be 9.5 mm on the femoral side and 10 mm on the tibial side. The length of the graft was 240 mm. The graft was placed on the Graftmaster table under 15 mmHg of tension and kept moist on the back table until implantation.   The attention was then turned to tunnel creation. The femoral tunnel cutting guide was then placed through the lateral portal. The arthroscope was placed in the medial portal at this point. The intercondylar distance was measured at 40 mm at. A flip cutter drill guide was advanced into the intercondylar notch. The blade was engaged and the femoral tunnel was created in a retrograde fashion to 35 mm. A fiber stick suture was placed through the femoral tunnel brought out the lateral portal and clamped for later graft passage.   The attention was then turned to tibial tunnel creation. This was done with a fixed angle tibial retro-drill guide. A drill pin was inserted through the anterior tibia and advanced until it engaged the 10 mm drill bit. A tibial tunnel was then created in a retrograde fashion. The fiber stick was brought out through the tibial tunnel. The 4 stranded hamstring tibial autograft was then shuttled through the knee using the fiber stick graft. Once the button was flipped on the lateral femoral cortex FluoroScan image was taken to confirm it was laying flat against the lateral cortex of the femur. Once this was confirmed the hamstring graft was advanced into position using the white suture ends of the Arthrex tight rope RC button. The graft was bottomed out into the femoral tunnel. The knee was then cycled 25 times to remove creep. The knee was then flexed approximately 30. An Arthrex bio composite interference screw 10 x 28 mm was then advanced into position  with countertraction on the tibial side of the graft and a posterior drawer force directed to the tibia. Once the interference screw was in position, an 11 mm spiked staple was placed over the distal end of the graft on the tibial side as backup fixation.  The patient had a firm endpoint without anterior laxity on Lachman's test. The range of motion was 0-120. Final arthroscopic images of the graft were taken. There was no graft impingement in full extension. The wounds were copiously irrigated. The deep fascia of the anterior tibial incision was closed with interrupted 0 Vicryl.  The of the tibial incision subcutaneous tissue was closed with a 2-0 Vicryl and the skin was approximated with a running 4-0 Monocryl. The arthroscopy portal incisions were closed with 4-0 nylon along with the small stab incision over the lateral femur used for placement of the femoral tunnel.  Patient had a dry sterile dressing applied along with Steri-Strips and Xeroform. The incisions and the joint were injected with 0.25% Marcaine plain.  Patient had a Polar Care sleeve along with a hinged knee brace locked in extension. The patient was brought to the PACU in stable condition. I was scrubbed and present the entire case and all sharp and instrument counts were correct at conclusion the case. ..  The patient was awakened and brought to PACU in stable condition. I spoke with the patient's wife in the  postop consultation room to let her know that patient was stable in recovery room the case had been performed without complication.  I reviewed the postoperative instructions with her and answered all her questions.

## 2021-04-28 NOTE — Anesthesia Postprocedure Evaluation (Signed)
Anesthesia Post Note  Patient: Andrew Clayton  Procedure(s) Performed: KNEE ARTHROSCOPY WITH ANTERIOR CRUCIATE LIGAMENT (ACL) REPAIR WITH HAMSTRING GRAFT (Right: Knee)  Patient location during evaluation: PACU Anesthesia Type: General Level of consciousness: awake and alert Pain management: pain level controlled Vital Signs Assessment: post-procedure vital signs reviewed and stable Respiratory status: spontaneous breathing, nonlabored ventilation, respiratory function stable and patient connected to nasal cannula oxygen Cardiovascular status: blood pressure returned to baseline and stable Postop Assessment: no apparent nausea or vomiting Anesthetic complications: no   No notable events documented.   Last Vitals:  Vitals:   04/28/21 1112 04/28/21 1115  BP: 121/83 116/84  Pulse: 79 78  Resp: 17 16  Temp: 36.5 C   SpO2: 99% 100%    Last Pain:  Vitals:   04/28/21 1112  TempSrc:   PainSc: Asleep                 Corinda Gubler

## 2021-04-28 NOTE — Anesthesia Preprocedure Evaluation (Signed)
Anesthesia Evaluation  Patient identified by MRN, date of birth, ID band Patient awake    Reviewed: Allergy & Precautions, NPO status , Patient's Chart, lab work & pertinent test results  History of Anesthesia Complications Negative for: history of anesthetic complications  Airway Mallampati: II  TM Distance: >3 FB Neck ROM: Full    Dental no notable dental hx. 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 2013: Hypothyroidism due to Hashimoto's thyroiditis 2013: Pulmonary embolism (HCC) 2013: Superficial thrombophlebitis of both legs No date: Umbilical hernia     Comment:  not repaired  Reproductive/Obstetrics                             Anesthesia Physical Anesthesia Plan  ASA: 2  Anesthesia Plan: General   Post-op Pain Management:    Induction: Intravenous  PONV Risk Score and Plan: 3 and Ondansetron, Dexamethasone and Midazolam  Airway Management Planned: Oral ETT  Additional Equipment: None  Intra-op Plan:   Post-operative Plan: Extubation in OR  Informed Consent: I have reviewed the patients  History and Physical, chart, labs and discussed the procedure including the risks, benefits and alternatives for the proposed anesthesia with the patient or authorized representative who has indicated his/her understanding and acceptance.     Dental advisory given  Plan Discussed with: CRNA and Surgeon  Anesthesia Plan Comments: (Discussed risks of anesthesia with patient, including PONV, sore throat, lip/dental damage. Rare risks discussed as well, such as cardiorespiratory and neurological sequelae, and allergic reactions. Discussed the role of CRNA in patient's perioperative care. Patient understands.)        Anesthesia Quick Evaluation

## 2021-04-28 NOTE — Op Note (Signed)
PREOPERATIVE H&P  Chief Complaint: Right Knee ACL Tear  HPI: Andrew Clayton is a 50 y.o. male who presents for preoperative history and physical with a diagnosis of Right Knee ACL Tear. Symptoms of pain and instability are significantly impairing activities of daily living.  He has agreed with surgical management.   Past Medical History:  Diagnosis Date   ADHD (attention deficit hyperactivity disorder)    DVT (deep venous thrombosis) (HCC) 2013   bilateral   Hypothyroidism due to Hashimoto's thyroiditis 2013   Pulmonary embolism (HCC) 2013   Superficial thrombophlebitis of both legs 2013   Umbilical hernia    not repaired   Past Surgical History:  Procedure Laterality Date   COLONOSCOPY  2014   rapid lysis  04/04/2012   venogram thrombolysis   Social History   Socioeconomic History   Marital status: Married    Spouse name: Cala Bradford   Number of children: 2   Years of education: Not on file   Highest education level: Not on file  Occupational History   Not on file  Tobacco Use   Smoking status: Never   Smokeless tobacco: Never  Vaping Use   Vaping Use: Never used  Substance and Sexual Activity   Alcohol use: No   Drug use: No   Sexual activity: Not on file  Other Topics Concern   Not on file  Social History Narrative   Not on file   Social Determinants of Health   Financial Resource Strain: Not on file  Food Insecurity: Not on file  Transportation Needs: Not on file  Physical Activity: Not on file  Stress: Not on file  Social Connections: Not on file   Family History  Problem Relation Age of Onset   Pulmonary embolism Neg Hx    No Known Allergies Prior to Admission medications   Medication Sig Start Date End Date Taking? Authorizing Provider  amphetamine-dextroamphetamine (ADDERALL XR) 25 MG 24 hr capsule Take 50 mg by mouth every morning.    Yes [provider]  levothyroxine (SYNTHROID) 112 MCG tablet Take 112 mcg by mouth daily before breakfast.    Yes [provider]  XARELTO 20 MG TABS tablet TAKE 1 TABLET EVERY DAY 02/07/13  Yes Levert Feinstein, MD     Positive ROS: All other systems have been reviewed and were otherwise negative with the exception of those mentioned in the HPI and as above.  Physical Exam: General: Alert, no acute distress Cardiovascular: Regular rate and rhythm, no murmurs rubs or gallops.  No pedal edema Respiratory: Clear to auscultation bilaterally, no wheezes rales or rhonchi. No cyanosis, no use of accessory musculature GI: No organomegaly, abdomen is soft and non-tender nondistended with positive bowel sounds. Skin: Skin intact, no lesions within the operative field. Neurologic: Sensation intact distally Psychiatric: Patient is competent for consent with normal mood and affect Lymphatic: No cervical lymphadenopathy  MUSCULOSKELETAL: Right Knee: The patient's skin is intact. His range of motion is from 0-120 degrees. He has a small effusion, but no erythema or ecchymosis. He has no instability to varus or valgus stress testing at 0 and 30 degrees of flexion, but has approximately 5 mm of anterior translation on Lachman's and anterior drawer testing. He has a negative posterior drawer and distally is neurovascularly intact.   Assessment: Right Knee ACL Tear  Plan: Plan for Procedure(s): RIGHT KNEE ARTHROSCOPY WITH ANTERIOR CRUCIATE LIGAMENT (ACL) REPAIR WITH HAMSTRING AUTO VS. ALLOGRAFT  Reviewed the details of the operation as well as  the postoperative course with the patient.  I marked the right leg according to hospital's correct site of surgery protocol.  Preop history and physical was performed at the bedside this morning.  I marked the right leg according to hospital's correct site of surgery protocol after verbally confirming with the patient that this was the correct site of surgery.  I discussed the risks and benefits of surgery. The risks include but are not limited to infection,  bleeding , nerve or blood vessel injury, joint stiffness or loss of motion, persistent pain, weakness or instability, retear of the ACL, failure of the hardware or fixation and the need for further surgery including revision ACL. Medical risks include but are not limited to DVT and pulmonary embolism, myocardial infarction, stroke, pneumonia, respiratory failure and death. Patient understood these risks and wished to proceed.     Juanell Fairly, MD   04/28/2021 7:57 AM

## 2021-04-28 NOTE — Discharge Instructions (Signed)

## 2021-04-28 NOTE — Anesthesia Procedure Notes (Signed)
Procedure Name: Intubation Date/Time: 04/28/2021 8:08 AM Performed by: Elmarie Mainland, CRNA Pre-anesthesia Checklist: Patient identified, Emergency Drugs available, Suction available and Patient being monitored Patient Re-evaluated:Patient Re-evaluated prior to induction Oxygen Delivery Method: Circle system utilized Preoxygenation: Pre-oxygenation with 100% oxygen Induction Type: IV induction Ventilation: Mask ventilation without difficulty Laryngoscope Size: McGraph and 4 Grade View: Grade I Tube type: Oral Tube size: 7.5 mm Number of attempts: 1 Airway Equipment and Method: Stylet, Oral airway and Video-laryngoscopy Placement Confirmation: ETT inserted through vocal cords under direct vision, positive ETCO2 and breath sounds checked- equal and bilateral Secured at: 23 cm Tube secured with: Tape Dental Injury: Teeth and Oropharynx as per pre-operative assessment

## 2021-05-01 NOTE — H&P (Signed)
PREOPERATIVE H&P   Chief Complaint: Right Knee ACL Tear   HPI: Andrew Clayton is a 50 y.o. male who presents for preoperative history and physical with a diagnosis of Right Knee ACL Tear. Symptoms of pain and instability are significantly impairing activities of daily living.  He has agreed with surgical management.        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 thyroiditis 2013   Pulmonary embolism (HCC) 2013   Superficial thrombophlebitis of both legs 2013   Umbilical hernia      not repaired         Past Surgical History:  Procedure Laterality Date   COLONOSCOPY   2014   rapid lysis   04/04/2012    venogram thrombolysis    Social History         Socioeconomic History   Marital status: Married      Spouse name: Cala Bradford   Number of children: 2   Years of education: Not on file   Highest education level: Not on file  Occupational History   Not on file  Tobacco Use   Smoking status: Never   Smokeless tobacco: Never  Vaping Use   Vaping Use: Never used  Substance and Sexual Activity   Alcohol use: No   Drug use: No   Sexual activity: Not on file  Other Topics Concern   Not on file  Social History Narrative   Not on file    Social Determinants of Health    Financial Resource Strain: Not on file  Food Insecurity: Not on file  Transportation Needs: Not on file  Physical Activity: Not on file  Stress: Not on file  Social Connections: Not on file         Family History  Problem Relation Age of Onset   Pulmonary embolism Neg Hx      No Known Allergies        Prior to Admission medications   Medication Sig Start Date End Date Taking? Authorizing Provider  amphetamine-dextroamphetamine (ADDERALL XR) 25 MG 24 hr capsule Take 50 mg by mouth every morning.      Yes [provider]  levothyroxine (SYNTHROID) 112 MCG  tablet Take 112 mcg by mouth daily before breakfast.     Yes [provider]  XARELTO 20 MG TABS tablet TAKE 1 TABLET EVERY DAY 02/07/13   Yes Levert Feinstein, MD        Positive ROS: All other systems have been reviewed and were otherwise negative with the exception of those mentioned in the HPI and as above.   Physical Exam: General: Alert, no acute distress Cardiovascular: Regular rate and rhythm, no murmurs rubs or gallops.  No pedal edema Respiratory: Clear to auscultation bilaterally, no wheezes rales or rhonchi. No cyanosis, no use of accessory musculature GI: No organomegaly, abdomen is soft and non-tender nondistended with positive bowel sounds. Skin: Skin intact, no lesions within the operative field. Neurologic: Sensation intact distally Psychiatric: Patient is competent for consent with normal mood and affect Lymphatic: No cervical lymphadenopathy   MUSCULOSKELETAL: Right Knee: The patient's skin is intact. His range of motion is from 0-120 degrees. He has a small effusion, but no erythema or ecchymosis. He has no instability to varus or valgus stress testing at 0 and 30 degrees of flexion, but has approximately 5 mm  of anterior translation on Lachman's and anterior drawer testing. He has a negative posterior drawer and distally is neurovascularly intact.    Assessment: Right Knee ACL Tear   Plan: Plan for Procedure(s): RIGHT KNEE ARTHROSCOPY WITH ANTERIOR CRUCIATE LIGAMENT (ACL) REPAIR WITH HAMSTRING AUTO VS. ALLOGRAFT   Reviewed the details of the operation as well as the postoperative course with the patient.  I marked the right leg according to hospital's correct site of surgery protocol.  Preop history and physical was performed at the bedside this morning.  I marked the right leg according to hospital's correct site of surgery protocol after verbally confirming with the patient that this was the correct site of surgery.   I discussed the risks and benefits  of surgery. The risks include but are not limited to infection, bleeding , nerve or blood vessel injury, joint stiffness or loss of motion, persistent pain, weakness or instability, retear of the ACL, failure of the hardware or fixation and the need for further surgery including revision ACL. Medical risks include but are not limited to DVT and pulmonary embolism, myocardial infarction, stroke, pneumonia, respiratory failure and death. Patient understood these risks and wished to proceed.        Juanell Fairly, MD     04/28/2021 7:57 AM          Electronically signed by Juanell Fairly, MD at 04/28/2021  8:05 AM

## 2021-05-06 ENCOUNTER — Other Ambulatory Visit: Payer: Self-pay

## 2021-05-06 ENCOUNTER — Ambulatory Visit
Admission: RE | Admit: 2021-05-06 | Discharge: 2021-05-06 | Disposition: A | Discharge status: Home / Self Care | Payer: Managed Care, Other (non HMO) | Source: Ambulatory Visit | Attending: Physician Assistant | Admitting: Physician Assistant

## 2021-05-06 ENCOUNTER — Other Ambulatory Visit: Payer: Self-pay | Admitting: Physician Assistant

## 2021-05-06 ENCOUNTER — Other Ambulatory Visit (HOSPITAL_COMMUNITY): Payer: Self-pay | Admitting: Physician Assistant

## 2021-05-06 DIAGNOSIS — R200 Anesthesia of skin: Secondary | ICD-10-CM

## 2021-05-06 DIAGNOSIS — Z09 Encounter for follow-up examination after completed treatment for conditions other than malignant neoplasm: Secondary | ICD-10-CM | POA: Diagnosis not present

## 2022-09-02 NOTE — Progress Notes (Signed)
Error, no show

## 2022-09-04 ENCOUNTER — Ambulatory Visit: Payer: Managed Care, Other (non HMO) | Attending: Internal Medicine | Admitting: Internal Medicine

## 2022-09-04 DIAGNOSIS — R0609 Other forms of dyspnea: Secondary | ICD-10-CM

## 2022-09-04 DIAGNOSIS — D6861 Antiphospholipid syndrome: Secondary | ICD-10-CM

## 2022-09-04 DIAGNOSIS — I2699 Other pulmonary embolism without acute cor pulmonale: Secondary | ICD-10-CM

## 2022-09-04 DIAGNOSIS — Z7901 Long term (current) use of anticoagulants: Secondary | ICD-10-CM

## 2022-09-04 DIAGNOSIS — I82409 Acute embolism and thrombosis of unspecified deep veins of unspecified lower extremity: Secondary | ICD-10-CM

## 2022-09-04 DIAGNOSIS — R0602 Shortness of breath: Secondary | ICD-10-CM

## 2022-10-08 NOTE — Progress Notes (Deleted)
Cardiology Office Note:    Date:  10/08/2022   ID:  Andrew Clayton, DOB Jun 25, 1971, MRN [id number]  PCP:  Merri Brunette, MD  Cardiologist:  None  Electrophysiologist:  None   Referring MD: Merri Brunette, MD   No chief complaint on file. ***  History of Present Illness:    Andrew Clayton is a 52 y.o. male with a hx of PE/DVT in setting of antiphospholipid antibody syndrome, hypothyroidism, ADHD who is referred by Dr. Renne Crigler for evaluation of shortness of breath.  Calcium score 12/2020  Past Medical History:  Diagnosis Date   ADHD (attention deficit hyperactivity disorder)    DVT (deep venous thrombosis) (HCC) 2013   bilateral   Hypothyroidism due to Hashimoto's thyroiditis 2013   Pulmonary embolism (HCC) 2013   Superficial thrombophlebitis of both legs 2013   Umbilical hernia    not repaired    Past Surgical History:  Procedure Laterality Date   COLONOSCOPY  2014   KNEE ARTHROSCOPY WITH ANTERIOR CRUCIATE LIGAMENT (ACL) REPAIR WITH HAMSTRING GRAFT Right 04/28/2021   Procedure: KNEE ARTHROSCOPY WITH ANTERIOR CRUCIATE LIGAMENT (ACL) REPAIR WITH HAMSTRING GRAFT;  Surgeon: Juanell Fairly, MD;  Location: [facility] ORS;  Service: Orthopedics;  Laterality: Right;   rapid lysis  04/04/2012   venogram thrombolysis    Current Medications: No outpatient medications have been marked as taking for the 10/09/22 encounter (Appointment) with Little Ishikawa, MD.     Allergies:   Patient has no known allergies.   Social History   Socioeconomic History   Marital status: Married    Spouse name: Cala Bradford   Number of children: 2   Years of education: Not on file   Highest education level: Not on file  Occupational History   Not on file  Tobacco Use   Smoking status: Never   Smokeless tobacco: Never  Vaping Use   Vaping Use: Never used  Substance and Sexual Activity   Alcohol use: No   Drug use: No   Sexual activity: Not on file  Other Topics Concern   Not on file  Social  History Narrative   Not on file   Social Determinants of Health   Financial Resource Strain: Not on file  Food Insecurity: Not on file  Transportation Needs: Not on file  Physical Activity: Not on file  Stress: Not on file  Social Connections: Not on file     Family History: The patient's ***family history is negative for Pulmonary embolism.  ROS:   Please see the history of present illness.    *** All other systems reviewed and are negative.  EKGs/Labs/Other Studies Reviewed:    The following studies were reviewed today: ***  EKG:  EKG is *** ordered today.  The ekg ordered today demonstrates ***  Recent Labs: No results found for requested labs within last 365 days.  Recent Lipid Panel No results found for: "CHOL", "TRIG", "HDL", "CHOLHDL", "VLDL", "LDLCALC", "LDLDIRECT"  Physical Exam:    VS:  There were no vitals taken for this visit.    Wt Readings from Last 3 Encounters:  04/28/21 237 lb (107.5 kg)  04/25/21 240 lb (108.9 kg)  08/12/19 240 lb (108.9 kg)     GEN: *** Well nourished, well developed in no acute distress HEENT: Normal NECK: No JVD; No carotid bruits LYMPHATICS: No lymphadenopathy CARDIAC: ***RRR, no murmurs, rubs, gallops RESPIRATORY:  Clear to auscultation without rales, wheezing or rhonchi  ABDOMEN: Soft, non-tender, non-distended MUSCULOSKELETAL:  No edema; No deformity  SKIN: Warm  and dry NEUROLOGIC:  Alert and oriented x 3 PSYCHIATRIC:  Normal affect   ASSESSMENT:    No diagnosis found. PLAN:    Dyspnea:  PE/DVT: In setting of antiphospholipid antibody syndrome.  On Xarelto  RTC in***  Medication Adjustments/Labs and Tests Ordered: Current medicines are reviewed at length with the patient today.  Concerns regarding medicines are outlined above.  No orders of the defined types were placed in this encounter.  No orders of the defined types were placed in this encounter.   There are no Patient Instructions on file for this  visit.   Signed, Little Ishikawa, MD  10/08/2022 1:01 PM    [facility]

## 2022-10-09 ENCOUNTER — Ambulatory Visit: Payer: Managed Care, Other (non HMO) | Admitting: Cardiology

## 2022-10-09 NOTE — Progress Notes (Signed)
Cardiology Office Note:    Date:  10/10/2022   ID:  Andrew Clayton, DOB 1971-06-07, MRN [id number]  PCP:  Andrew Brunette, MD  Cardiologist:  None  Electrophysiologist:  None   Referring MD: Andrew Brunette, MD   Chief Complaint  Patient presents with   Shortness of Breath    History of Present Illness:    Andrew Clayton is a 52 y.o. male with a hx of PE/DVT in setting of antiphospholipid antibody syndrome, hypothyroidism, ADHD, CKD who is referred by Dr. Renne Clayton for evaluation of shortness of breath.  He reports has had recent episodes of shortness of breath.  Denies any chest pain.  States that episodes happen suddenly and last for about 1 minute.  Also reports gets short of breath with exertion.  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  Denies any syncope.  Denies any lower extremity edema or palpitations.  Has history of multiple DVTs, none since starting Xarelto.  No bleeding issues on Xarelto.  No smoking history.  Family history includes mother had CVA in 43s.  Calcium score 12/2020 was 0.  Past Medical History:  Diagnosis Date   ADHD (attention deficit hyperactivity disorder)    DVT (deep venous thrombosis) 2013   bilateral   Hypothyroidism due to Hashimoto's thyroiditis 2013   Pulmonary embolism 2013   Superficial thrombophlebitis of both legs 2013   Umbilical hernia    not repaired    Past Surgical History:  Procedure Laterality Date   COLONOSCOPY  2014   KNEE ARTHROSCOPY WITH ANTERIOR CRUCIATE LIGAMENT (ACL) REPAIR WITH HAMSTRING GRAFT Right 04/28/2021   Procedure: KNEE ARTHROSCOPY WITH ANTERIOR CRUCIATE LIGAMENT (ACL) REPAIR WITH HAMSTRING GRAFT;  Surgeon: Andrew Fairly, MD;  Location: [facility] ORS;  Service: Orthopedics;  Laterality: Right;   rapid lysis  04/04/2012   venogram thrombolysis    Current Medications: Current Meds   Medication Sig   amphetamine-dextroamphetamine (ADDERALL XR) 25 MG 24 hr capsule Take 50 mg by mouth every morning.    levothyroxine (SYNTHROID) 112 MCG tablet Take 112 mcg by mouth daily before breakfast.   losartan (COZAAR) 25 MG tablet Take 25 mg by mouth daily.   XARELTO 20 MG TABS tablet TAKE 1 TABLET EVERY DAY     Allergies:   Patient has no known allergies.   Social History   Socioeconomic History   Marital status: Married    Spouse name: Andrew Clayton   Number of children: 2   Years of education: Not on file   Highest education level: Not on file  Occupational History   Not on file  Tobacco Use   Smoking status: Never   Smokeless tobacco: Never  Vaping Use   Vaping Use: Never used  Substance and Sexual Activity   Alcohol use: No   Drug use: No   Sexual activity: Not on file  Other Topics Concern   Not on file  Social History Narrative   Not on file   Social Determinants of Health   Financial Resource Strain: Not on file  Food Insecurity: Not on file  Transportation Needs: Not on file  Physical Activity: Not on file  Stress: Not on file  Social Connections: Not on file     Family History: The patient's family history is negative for Pulmonary embolism.  ROS:   Please see the history of present illness.  All other systems reviewed and are negative.  EKGs/Labs/Other Studies Reviewed:    The following studies were reviewed today:   EKG:   10/10/2022: Normal sinus rhythm, rate 79, no ST abnormalities  Recent Labs: No results found for requested labs within last 365 days.  Recent Lipid Panel No results found for: "CHOL", "TRIG", "HDL", "CHOLHDL", "VLDL", "LDLCALC", "LDLDIRECT"  Physical Exam:    VS:  BP 128/80 (BP Location: Right Arm, Patient Position: Sitting, Cuff Size: Normal)   Pulse 79   Ht 6\' 1"  (1.854 m)   Wt 248 lb 6.4 oz (112.7 kg)   SpO2 95%   BMI 32.77 kg/m     Wt Readings from Last 3 Encounters:  10/10/22 248 lb 6.4 oz (112.7 kg)   04/28/21 237 lb (107.5 kg)  04/25/21 240 lb (108.9 kg)     GEN:  Well nourished, well developed in no acute distress HEENT: Normal NECK: No JVD; No carotid bruits LYMPHATICS: No lymphadenopathy CARDIAC: RRR, no murmurs, rubs, gallops RESPIRATORY:  Clear to auscultation without rales, wheezing or rhonchi  ABDOMEN: Soft, non-tender, non-distended MUSCULOSKELETAL:  No edema; No deformity  SKIN: Warm and dry NEUROLOGIC:  Alert and oriented x 3 PSYCHIATRIC:  Normal affect   ASSESSMENT:    1. Shortness of breath   2. Lightheadedness   3. Hyperlipidemia, unspecified hyperlipidemia type   4. Stage 3a chronic kidney disease   5. Deep vein thrombosis (DVT) of lower extremity, unspecified chronicity, unspecified laterality, unspecified vein    PLAN:    Dyspnea/lightheadedness: Reports dyspnea as well as lightheadedness with exertion.  Recommend echocardiogram to rule out structural heart disease.  Could represent anginal equivalent, not a good coronary CTA candidate given his CKD.  Recommend exercise Myoview for further evaluation.  Check TSH, CMET, CBC  PE/DVT: In setting of antiphospholipid antibody syndrome.  On Xarelto  CKD stage IIIa: Creatinine 1.44 11/2021.  Check CMET  Hyperlipidemia: LDL 135 09/15/2019.  Check lipid panel  RTC in 3 months  Shared Decision Making/Informed Consent The risks [chest pain, shortness of breath, cardiac arrhythmias, dizziness, blood pressure fluctuations, myocardial infarction, stroke/transient ischemic attack, nausea, vomiting, allergic reaction, radiation exposure, metallic taste sensation and life-threatening complications (estimated to be 1 in 10,000)], benefits (risk stratification, diagnosing coronary artery disease, treatment guidance) and alternatives of a nuclear stress test were discussed in detail with Andrew Clayton and he agrees to proceed.   Medication Adjustments/Labs and Tests Ordered: Current medicines are reviewed at length with the  patient today.  Concerns regarding medicines are outlined above.  Orders Placed This Encounter  Procedures   Comprehensive metabolic panel   CBC   Lipid panel   TSH   MYOCARDIAL PERFUSION IMAGING   EKG 12-Lead   ECHOCARDIOGRAM COMPLETE   No orders of the defined types were placed in this encounter.   Patient Instructions  Medication Instructions:  Your physician recommends that you continue on your current medications as directed. Please refer to the Current Medication list given to you today.  *If you need a refill on your cardiac medications before your next appointment, please call your pharmacy*   Lab Work: CMET, CBC, Lipid, TSH today  If you have labs (blood work) drawn today and your tests are completely normal, you will receive your results only by: MyChart Message (if you have MyChart) OR A paper copy in the mail If you have any lab test that is abnormal or we need to change your treatment, we will call you to review the results.  Testing/Procedures: Your physician has requested that you have an exercise stress myoview.   Your physician has requested that you have an echocardiogram. Echocardiography is a painless test that uses sound waves to create images of your heart. It provides your doctor with information about the size and shape of your heart and how well your heart's chambers and valves are working. This procedure takes approximately one hour. There are no restrictions for this procedure. Please do NOT wear cologne, perfume, aftershave, or lotions (deodorant is allowed). Please arrive 15 minutes prior to your appointment time.   Follow-Up: At [facility], you and your health needs are our priority.  As part of our continuing mission to provide you with exceptional heart care, we have created designated Provider Care Teams.  These Care Teams include your primary Cardiologist (physician) and Advanced Practice Providers (APPs -  Physician Assistants and  Nurse Practitioners) who all work together to provide you with the care you need, when you need it.  We recommend signing up for the patient portal called "MyChart".  Sign up information is provided on this After Visit Summary.  MyChart is used to connect with patients for Virtual Visits (Telemedicine).  Patients are able to view lab/test results, encounter notes, upcoming appointments, etc.  Non-urgent messages can be sent to your provider as well.   To learn more about what you can do with MyChart, go to [email or web address].    Your next appointment:   3 month(s)  Provider:   Dr. Bjorn Pippin   Signed, Little Ishikawa, MD  10/10/2022 9:05 AM    [facility]

## 2022-10-10 ENCOUNTER — Encounter: Payer: Self-pay | Admitting: Cardiology

## 2022-10-10 ENCOUNTER — Ambulatory Visit: Payer: Managed Care, Other (non HMO) | Attending: Internal Medicine | Admitting: Cardiology

## 2022-10-10 VITALS — BP 128/80 | HR 79 | Ht 73.0 in | Wt 248.4 lb

## 2022-10-10 DIAGNOSIS — I82409 Acute embolism and thrombosis of unspecified deep veins of unspecified lower extremity: Secondary | ICD-10-CM

## 2022-10-10 DIAGNOSIS — R0602 Shortness of breath: Secondary | ICD-10-CM

## 2022-10-10 DIAGNOSIS — R42 Dizziness and giddiness: Secondary | ICD-10-CM | POA: Diagnosis not present

## 2022-10-10 DIAGNOSIS — N1831 Chronic kidney disease, stage 3a: Secondary | ICD-10-CM

## 2022-10-10 DIAGNOSIS — E785 Hyperlipidemia, unspecified: Secondary | ICD-10-CM

## 2022-10-10 NOTE — Patient Instructions (Signed)
Medication Instructions:  Your physician recommends that you continue on your current medications as directed. Please refer to the Current Medication list given to you today.  *If you need a refill on your cardiac medications before your next appointment, please call your pharmacy*   Lab Work: CMET, CBC, Lipid, TSH today  If you have labs (blood work) drawn today and your tests are completely normal, you will receive your results only by: MyChart Message (if you have MyChart) OR A paper copy in the mail If you have any lab test that is abnormal or we need to change your treatment, we will call you to review the results.   Testing/Procedures: Your physician has requested that you have an exercise stress myoview.   Your physician has requested that you have an echocardiogram. Echocardiography is a painless test that uses sound waves to create images of your heart. It provides your doctor with information about the size and shape of your heart and how well your heart's chambers and valves are working. This procedure takes approximately one hour. There are no restrictions for this procedure. Please do NOT wear cologne, perfume, aftershave, or lotions (deodorant is allowed). Please arrive 15 minutes prior to your appointment time.   Follow-Up: At [facility], you and your health needs are our priority.  As part of our continuing mission to provide you with exceptional heart care, we have created designated Provider Care Teams.  These Care Teams include your primary Cardiologist (physician) and Advanced Practice Providers (APPs -  Physician Assistants and Nurse Practitioners) who all work together to provide you with the care you need, when you need it.  We recommend signing up for the patient portal called "MyChart".  Sign up information is provided on this After Visit Summary.  MyChart is used to connect with patients for Virtual Visits (Telemedicine).  Patients are able to view  lab/test results, encounter notes, upcoming appointments, etc.  Non-urgent messages can be sent to your provider as well.   To learn more about what you can do with MyChart, go to [email or web address].    Your next appointment:   3 month(s)  Provider:   Dr. Bjorn Pippin

## 2022-10-12 ENCOUNTER — Other Ambulatory Visit: Payer: Self-pay | Admitting: *Deleted

## 2022-10-12 DIAGNOSIS — N1831 Chronic kidney disease, stage 3a: Secondary | ICD-10-CM

## 2022-10-12 DIAGNOSIS — Z79899 Other long term (current) drug therapy: Secondary | ICD-10-CM

## 2022-10-12 LAB — COMPREHENSIVE METABOLIC PANEL
ALT: 34 IU/L (ref 0–44)
AST: 33 IU/L (ref 0–40)
Albumin/Globulin Ratio: 1.8 (ref 1.2–2.2)
Albumin: 4.6 g/dL (ref 4.1–5.1)
Alkaline Phosphatase: 63 IU/L (ref 44–121)
BUN/Creatinine Ratio: 15 (ref 9–20)
BUN: 27 mg/dL — ABNORMAL HIGH (ref 6–24)
Bilirubin Total: 0.7 mg/dL (ref 0.0–1.2)
CO2: 18 mmol/L — ABNORMAL LOW (ref 20–29)
Calcium: 9.6 mg/dL (ref 8.7–10.2)
Chloride: 103 mmol/L (ref 96–106)
Creatinine, Ser: 1.85 mg/dL — ABNORMAL HIGH (ref 0.76–1.27)
Globulin, Total: 2.5 g/dL (ref 1.5–4.5)
Glucose: 105 mg/dL — ABNORMAL HIGH (ref 70–99)
Potassium: 5 mmol/L (ref 3.5–5.2)
Sodium: 140 mmol/L (ref 134–144)
Total Protein: 7.1 g/dL (ref 6.0–8.5)
eGFR: 44 mL/min/{1.73_m2} — ABNORMAL LOW (ref >59)

## 2022-10-12 LAB — LIPID PANEL
Chol/HDL Ratio: 7.1 ratio — ABNORMAL HIGH (ref 0.0–5.0)
Cholesterol, Total: 177 mg/dL (ref 100–199)
HDL: 25 mg/dL — ABNORMAL LOW (ref >39)
LDL Chol Calc (NIH): 113 mg/dL — ABNORMAL HIGH (ref 0–99)
Triglycerides: 221 mg/dL — ABNORMAL HIGH (ref 0–149)
VLDL Cholesterol Cal: 39 mg/dL (ref 5–40)

## 2022-10-12 LAB — TSH: TSH: 2.21 u[IU]/mL (ref 0.450–4.500)

## 2022-10-12 LAB — CBC
Hematocrit: 49.8 % (ref 37.5–51.0)
Hemoglobin: 16.8 g/dL (ref 13.0–17.7)
MCH: 29.5 pg (ref 26.6–33.0)
MCHC: 33.7 g/dL (ref 31.5–35.7)
MCV: 88 fL (ref 79–97)
Platelets: 363 10*3/uL (ref 150–450)
RBC: 5.69 x10E6/uL (ref 4.14–5.80)
RDW: 13.3 % (ref 11.6–15.4)
WBC: 7.9 10*3/uL (ref 3.4–10.8)

## 2022-10-31 ENCOUNTER — Encounter: Payer: Self-pay | Admitting: *Deleted

## 2022-11-04 LAB — BASIC METABOLIC PANEL
BUN/Creatinine Ratio: 15 (ref 9–20)
BUN: 24 mg/dL (ref 6–24)
CO2: 21 mmol/L (ref 20–29)
Calcium: 9.9 mg/dL (ref 8.7–10.2)
Chloride: 103 mmol/L (ref 96–106)
Creatinine, Ser: 1.62 mg/dL — ABNORMAL HIGH (ref 0.76–1.27)
Glucose: 122 mg/dL — ABNORMAL HIGH (ref 70–99)
Potassium: 4.2 mmol/L (ref 3.5–5.2)
Sodium: 139 mmol/L (ref 134–144)
eGFR: 52 mL/min/{1.73_m2} — ABNORMAL LOW (ref >59)

## 2022-11-08 ENCOUNTER — Telehealth (HOSPITAL_COMMUNITY): Payer: Self-pay | Admitting: *Deleted

## 2022-11-08 NOTE — Telephone Encounter (Signed)
Patient given detailed instructions per Myocardial Perfusion Study Information Sheet for the test on 11/15/22 Patient notified to arrive 15 minutes early and that it is imperative to arrive on time for appointment to keep from having the test rescheduled.  If you need to cancel or reschedule your appointment, please call the office within 24 hours of your appointment. . Patient verbalized understanding. Binnie Droessler Jacqueline   

## 2022-11-14 ENCOUNTER — Encounter: Payer: Self-pay | Admitting: *Deleted

## 2022-11-15 ENCOUNTER — Ambulatory Visit (HOSPITAL_BASED_OUTPATIENT_CLINIC_OR_DEPARTMENT_OTHER): Payer: Managed Care, Other (non HMO)

## 2022-11-15 ENCOUNTER — Ambulatory Visit (HOSPITAL_COMMUNITY): Payer: Managed Care, Other (non HMO) | Attending: Cardiology

## 2022-11-15 DIAGNOSIS — R0602 Shortness of breath: Secondary | ICD-10-CM | POA: Insufficient documentation

## 2022-11-15 LAB — MYOCARDIAL PERFUSION IMAGING
Angina Index: 0
Duke Treadmill Score: 9
Estimated workload: 10.1
Exercise duration (min): 8 min
Exercise duration (sec): 59 s
LV dias vol: 72 mL (ref 62–150)
LV sys vol: 27 mL
MPHR: 171 {beats}/min
Nuc Stress EF: 62 %
Peak HR: 150 {beats}/min
Percent HR: 87 %
Rest HR: 73 {beats}/min
Rest Nuclear Isotope Dose: 10.9 mCi
SDS: 0
SRS: 0
SSS: 0
ST Depression (mm): 0 mm
Stress Nuclear Isotope Dose: 31.6 mCi
TID: 0.73

## 2022-11-15 LAB — ECHOCARDIOGRAM COMPLETE
Area-P 1/2: 3.65 cm2
S' Lateral: 2.7 cm

## 2022-11-15 MED ORDER — TECHNETIUM TC 99M TETROFOSMIN IV KIT
10.9000 | PACK | Freq: Once | INTRAVENOUS | Status: AC | PRN
  Administered 2022-11-15: 10.9 via INTRAVENOUS

## 2022-11-15 MED ORDER — TECHNETIUM TC 99M TETROFOSMIN IV KIT
31.6000 | PACK | Freq: Once | INTRAVENOUS | Status: AC | PRN
  Administered 2022-11-15: 31.6 via INTRAVENOUS

## 2022-11-30 LAB — LAB REPORT - SCANNED: EGFR: 47

## 2022-12-05 ENCOUNTER — Encounter: Payer: Self-pay | Admitting: Internal Medicine

## 2023-01-14 NOTE — Progress Notes (Deleted)
Cardiology Office Note:    Date:  01/14/2023   ID:  Andrew Clayton, DOB [birth year], MRN [id number]  PCP:  Merri Brunette, MD  Cardiologist:  None  Electrophysiologist:  None   Referring MD: Merri Brunette, MD   No chief complaint on file.   History of Present Illness:    Andrew Clayton is a 52 y.o. male with a hx of PE/DVT in setting of antiphospholipid antibody syndrome, hypothyroidism, ADHD, CKD who presents for follow-up.  He was referred by Dr. Renne Crigler for evaluation of shortness of breath, initially seen 10/10/2022.  He reports has had recent episodes of shortness of breath.  Denies any chest pain.  States that episodes happen suddenly and last for about 1 minute.  Also reports gets short of breath with exertion.  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  Denies any syncope.  Denies any lower extremity edema or palpitations.  Has history of multiple DVTs, none since starting Xarelto.  No bleeding issues on Xarelto.  No smoking history.  Family history includes mother had CVA in 47s.  Calcium score 12/2020 was 0.  Echocardiogram 11/15/2022 showed EF 55 to 60%, normal RV function, no significant valvular disease.  Exercise Myoview 11/15/2022 showed good exercise capacity (achieved 10.1 METS), normal perfusion, EF 62%.  Since last clinic visit,  Past Medical History:  Diagnosis Date   ADHD (attention deficit hyperactivity disorder)    DVT (deep venous thrombosis) (HCC) 2013   bilateral   Hypothyroidism due to Hashimoto's thyroiditis 2013   Pulmonary embolism (HCC) 2013   Superficial thrombophlebitis of both legs 2013   Umbilical hernia    not repaired    Past Surgical History:  Procedure Laterality Date   COLONOSCOPY  2014   KNEE ARTHROSCOPY WITH ANTERIOR CRUCIATE LIGAMENT (ACL) REPAIR WITH HAMSTRING GRAFT Right 04/28/2021   Procedure: KNEE  ARTHROSCOPY WITH ANTERIOR CRUCIATE LIGAMENT (ACL) REPAIR WITH HAMSTRING GRAFT;  Surgeon: Juanell Fairly, MD;  Location: [facility] ORS;  Service: Orthopedics;  Laterality: Right;   rapid lysis  04/04/2012   venogram thrombolysis    Current Medications: No outpatient medications have been marked as taking for the 01/17/23 encounter (Appointment) with Little Ishikawa, MD.     Allergies:   Patient has no known allergies.   Social History   Socioeconomic History   Marital status: Married    Spouse name: Cala Bradford   Number of children: 2   Years of education: Not on file   Highest education level: Not on file  Occupational History   Not on file  Tobacco Use   Smoking status: Never   Smokeless tobacco: Never  Vaping Use   Vaping status: Never Used  Substance and Sexual Activity   Alcohol use: No   Drug use: No   Sexual activity: Not on file  Other Topics Concern   Not on file  Social History Narrative   Not on file   Social Determinants of Health   Financial Resource Strain: Not on file  Food Insecurity: Not on file  Transportation Needs: Not on file  Physical Activity: Not on file  Stress: Not on file  Social Connections: Not on file     Family History: The patient's family history is negative for Pulmonary embolism.  ROS:   Please see the history of present illness.     All other systems reviewed and  are negative.  EKGs/Labs/Other Studies Reviewed:    The following studies were reviewed today:   EKG:   10/10/2022: Normal sinus rhythm, rate 79, no ST abnormalities  Recent Labs: 10/10/2022: ALT 34; Hemoglobin 16.8; Platelets 363; TSH 2.210 11/03/2022: BUN 24; Creatinine, Ser 1.62; Potassium 4.2; Sodium 139  Recent Lipid Panel    Component Value Date/Time   CHOL 177 10/10/2022 0909   TRIG 221 (H) 10/10/2022 0909   HDL 25 (L) 10/10/2022 0909   CHOLHDL 7.1 (H) 10/10/2022 0909   LDLCALC 113 (H) 10/10/2022 0909    Physical Exam:    VS:  There were no  vitals taken for this visit.    Wt Readings from Last 3 Encounters:  11/15/22 248 lb (112.5 kg)  10/10/22 248 lb 6.4 oz (112.7 kg)  04/28/21 237 lb (107.5 kg)     GEN:  Well nourished, well developed in no acute distress HEENT: Normal NECK: No JVD; No carotid bruits LYMPHATICS: No lymphadenopathy CARDIAC: RRR, no murmurs, rubs, gallops RESPIRATORY:  Clear to auscultation without rales, wheezing or rhonchi  ABDOMEN: Soft, non-tender, non-distended MUSCULOSKELETAL:  No edema; No deformity  SKIN: Warm and dry NEUROLOGIC:  Alert and oriented x 3 PSYCHIATRIC:  Normal affect   ASSESSMENT:    No diagnosis found.  PLAN:    Dyspnea/lightheadedness: Reports dyspnea as well as lightheadedness with exertion.  Calcium score 12/2020 was 0; not a good coronary CTA candidate given CKD.  Echocardiogram 11/15/2022 showed EF 55 to 60%, normal RV function, no significant valvular disease.  Exercise Myoview 11/15/2022 showed good exercise capacity (achieved 10.1 METS), normal perfusion, EF 62%.  PE/DVT: In setting of antiphospholipid antibody syndrome.  On Xarelto  CKD stage IIIa: Creatinine 1.16 on 11/03/2022  Hyperlipidemia: LDL 113 on 10/10/2022.  Calcium score 12/2020 was 0  Hypertension: On losartan 100 mg daily***  RTC in 3 months***  Medication Adjustments/Labs and Tests Ordered: Current medicines are reviewed at length with the patient today.  Concerns regarding medicines are outlined above.  No orders of the defined types were placed in this encounter.  No orders of the defined types were placed in this encounter.   There are no Patient Instructions on file for this visit.   Signed, Little Ishikawa, MD  01/14/2023 6:57 PM    [facility]

## 2023-01-17 ENCOUNTER — Ambulatory Visit: Payer: Managed Care, Other (non HMO) | Admitting: Cardiology

## 2023-04-11 ENCOUNTER — Other Ambulatory Visit: Payer: Self-pay | Admitting: Nephrology

## 2023-04-11 DIAGNOSIS — N1831 Chronic kidney disease, stage 3a: Secondary | ICD-10-CM

## 2023-04-18 ENCOUNTER — Inpatient Hospital Stay: Admission: RE | Admit: 2023-04-18 | Payer: Managed Care, Other (non HMO) | Source: Ambulatory Visit

## 2023-04-24 NOTE — Progress Notes (Unsigned)
Cardiology Office Note:    Date:  04/26/2023   ID:  Andrew Clayton, DOB [birth year], MRN [id number]  PCP:  Merri Brunette, MD  Cardiologist:  None  Electrophysiologist:  None   Referring MD: Merri Brunette, MD   Chief Complaint  Patient presents with   Shortness of Breath    History of Present Illness:    Andrew Clayton is a 52 y.o. male with a hx of PE/DVT in setting of antiphospholipid antibody syndrome, hypothyroidism, ADHD, CKD who presents for follow-up.  He was referred by Dr. Renne Crigler for evaluation of shortness of breath, initially seen 10/10/2022.  He reports has had recent episodes of shortness of breath.  Denies any chest pain.  States that episodes happen suddenly and last for about 1 minute.  Also reports gets short of breath with exertion.  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  Denies any syncope.  Denies any lower extremity edema or palpitations.  Has history of multiple DVTs, none since starting Xarelto.  No bleeding issues on Xarelto.  No smoking history.  Family history includes mother had CVA in 28s.  Calcium score 12/2020 was 0.  Echocardiogram 10/2022 showed normal biventricular function, no significant valvular disease.  Lexiscan Myoview 10/2022 showed normal perfusion, EF 62%.  Since last clinic visit, he reports he is doing well.  States that shortness of breath resolved.  Denies any chest pain, lightheadedness, syncope, lower extremity edema, or palpitations.  Taking xarelto, denies any bleeding issues.  He has not been exercising.   Past Medical History:  Diagnosis Date   ADHD (attention deficit hyperactivity disorder)    DVT (deep venous thrombosis) (HCC) 2013   bilateral   Hypothyroidism due to Hashimoto's thyroiditis 2013   Pulmonary embolism (HCC) 2013   Superficial thrombophlebitis of both legs 2013   Umbilical hernia     not repaired    Past Surgical History:  Procedure Laterality Date   COLONOSCOPY  2014   KNEE ARTHROSCOPY WITH ANTERIOR CRUCIATE LIGAMENT (ACL) REPAIR WITH HAMSTRING GRAFT Right 04/28/2021   Procedure: KNEE ARTHROSCOPY WITH ANTERIOR CRUCIATE LIGAMENT (ACL) REPAIR WITH HAMSTRING GRAFT;  Surgeon: Juanell Fairly, MD;  Location: [facility] ORS;  Service: Orthopedics;  Laterality: Right;   rapid lysis  04/04/2012   venogram thrombolysis    Current Medications: Current Meds  Medication Sig   amphetamine-dextroamphetamine (ADDERALL XR) 25 MG 24 hr capsule Take 50 mg by mouth every morning.    levothyroxine (SYNTHROID) 125 MCG tablet Take 125 mcg by mouth daily before breakfast.   losartan (COZAAR) 50 MG tablet Take 1 tablet (50 mg total) by mouth daily.   XARELTO 20 MG TABS tablet TAKE 1 TABLET EVERY DAY   [DISCONTINUED] losartan (COZAAR) 100 MG tablet Take 50 mg by mouth daily.     Allergies:   Patient has no known allergies.   Social History   Socioeconomic History   Marital status: Married    Spouse name: Cala Bradford   Number of children: 2   Years of education: Not on file   Highest education level: Not on file  Occupational History   Not on file  Tobacco Use   Smoking status: Never   Smokeless tobacco: Never  Vaping Use   Vaping status: Never Used  Substance and Sexual Activity   Alcohol use: No   Drug use: No   Sexual activity: Not  on file  Other Topics Concern   Not on file  Social History Narrative   Not on file   Social Determinants of Health   Financial Resource Strain: Not on file  Food Insecurity: Not on file  Transportation Needs: Not on file  Physical Activity: Not on file  Stress: Not on file  Social Connections: Not on file     Family History: The patient's family history is negative for Pulmonary embolism.  ROS:   Please see the history of present illness.     All other systems reviewed and are negative.  EKGs/Labs/Other Studies Reviewed:    The  following studies were reviewed today:   EKG:   10/10/2022: Normal sinus rhythm, rate 79, no ST abnormalities 04/26/23: Normal sinus rhythm, rate 82, no ST abnormalities  Recent Labs: 10/10/2022: ALT 34; Hemoglobin 16.8; Platelets 363; TSH 2.210 11/03/2022: BUN 24; Creatinine, Ser 1.62; Potassium 4.2; Sodium 139  Recent Lipid Panel    Component Value Date/Time   CHOL 177 10/10/2022 0909   TRIG 221 (H) 10/10/2022 0909   HDL 25 (L) 10/10/2022 0909   CHOLHDL 7.1 (H) 10/10/2022 0909   LDLCALC 113 (H) 10/10/2022 0909    Physical Exam:    VS:  BP 106/60 (BP Location: Left Arm, Patient Position: Sitting, Cuff Size: Normal)   Pulse 82   Ht 6\' 1"  (1.854 m)   Wt 247 lb (112 kg)   SpO2 94%   BMI 32.59 kg/m     Wt Readings from Last 3 Encounters:  04/26/23 247 lb (112 kg)  11/15/22 248 lb (112.5 kg)  10/10/22 248 lb 6.4 oz (112.7 kg)     GEN:  Well nourished, well developed in no acute distress HEENT: Normal NECK: No JVD; No carotid bruits LYMPHATICS: No lymphadenopathy CARDIAC: RRR, no murmurs, rubs, gallops RESPIRATORY:  Clear to auscultation without rales, wheezing or rhonchi  ABDOMEN: Soft, non-tender, non-distended MUSCULOSKELETAL:  No edema; No deformity  SKIN: Warm and dry NEUROLOGIC:  Alert and oriented x 3 PSYCHIATRIC:  Normal affect   ASSESSMENT:    1. Shortness of breath   2. Lightheadedness   3. Deep vein thrombosis (DVT) of lower extremity, unspecified chronicity, unspecified laterality, unspecified vein (HCC)   4. Current use of long term anticoagulation   5. Hyperlipidemia, unspecified hyperlipidemia type   6. Essential hypertension     PLAN:    Dyspnea/lightheadedness: Reports dyspnea as well as lightheadedness with exertion.  Could represent anginal equivalent, not a good coronary CTA candidate given his CKD.  Echocardiogram 10/2022 showed normal biventricular function, no significant valvular disease.  Lexiscan Myoview 10/2022 showed normal perfusion, EF  62%.  PE/DVT: In setting of antiphospholipid antibody syndrome.  On Xarelto  CKD stage IIIa: Creatinine 1.62 on 11/03/2022.  Follows with nephrology  Hyperlipidemia: LDL 113 on 10/10/2022.  Calcium score 0 in 12/2020   Hypertension: On losartan 50 mg daily.  Appears controlled  RTC in 1 year    Medication Adjustments/Labs and Tests Ordered: Current medicines are reviewed at length with the patient today.  Concerns regarding medicines are outlined above.  Orders Placed This Encounter  Procedures   EKG 12-Lead   No orders of the defined types were placed in this encounter.   Patient Instructions  Medication Instructions:  No changes *If you need a refill on your cardiac medications before your next appointment, please call your pharmacy*  Follow-Up: At [facility], you and your health needs are our priority.  As part of our  continuing mission to provide you with exceptional heart care, we have created designated Provider Care Teams.  These Care Teams include your primary Cardiologist (physician) and Advanced Practice Providers (APPs -  Physician Assistants and Nurse Practitioners) who all work together to provide you with the care you need, when you need it.  We recommend signing up for the patient portal called "MyChart".  Sign up information is provided on this After Visit Summary.  MyChart is used to connect with patients for Virtual Visits (Telemedicine).  Patients are able to view lab/test results, encounter notes, upcoming appointments, etc.  Non-urgent messages can be sent to your provider as well.   To learn more about what you can do with MyChart, go to [email or web address].    Your next appointment:   1 year(s)  Provider:   Dr Bjorn Pippin    Signed, Little Ishikawa, MD  04/26/2023 11:51 AM    [facility]

## 2023-04-26 ENCOUNTER — Ambulatory Visit: Payer: Managed Care, Other (non HMO) | Admitting: Cardiology

## 2023-04-26 ENCOUNTER — Encounter: Payer: Self-pay | Admitting: Cardiology

## 2023-04-26 VITALS — BP 106/60 | HR 82 | Ht 73.0 in | Wt 247.0 lb

## 2023-04-26 DIAGNOSIS — I82409 Acute embolism and thrombosis of unspecified deep veins of unspecified lower extremity: Secondary | ICD-10-CM

## 2023-04-26 DIAGNOSIS — R0602 Shortness of breath: Secondary | ICD-10-CM | POA: Diagnosis not present

## 2023-04-26 DIAGNOSIS — E785 Hyperlipidemia, unspecified: Secondary | ICD-10-CM

## 2023-04-26 DIAGNOSIS — Z7901 Long term (current) use of anticoagulants: Secondary | ICD-10-CM

## 2023-04-26 DIAGNOSIS — R42 Dizziness and giddiness: Secondary | ICD-10-CM | POA: Diagnosis not present

## 2023-04-26 DIAGNOSIS — I10 Essential (primary) hypertension: Secondary | ICD-10-CM

## 2023-04-26 NOTE — Patient Instructions (Signed)
Medication Instructions:  No changes *If you need a refill on your cardiac medications before your next appointment, please call your pharmacy*  Follow-Up: At [facility], you and your health needs are our priority.  As part of our continuing mission to provide you with exceptional heart care, we have created designated Provider Care Teams.  These Care Teams include your primary Cardiologist (physician) and Advanced Practice Providers (APPs -  Physician Assistants and Nurse Practitioners) who all work together to provide you with the care you need, when you need it.  We recommend signing up for the patient portal called "MyChart".  Sign up information is provided on this After Visit Summary.  MyChart is used to connect with patients for Virtual Visits (Telemedicine).  Patients are able to view lab/test results, encounter notes, upcoming appointments, etc.  Non-urgent messages can be sent to your provider as well.   To learn more about what you can do with MyChart, go to [email or web address].    Your next appointment:   1 year(s)  Provider:   Dr Bjorn Pippin

## 2023-06-15 ENCOUNTER — Ambulatory Visit
Admission: RE | Admit: 2023-06-15 | Discharge: 2023-06-15 | Disposition: A | Discharge status: Home / Self Care | Payer: Managed Care, Other (non HMO) | Source: Ambulatory Visit | Attending: Nephrology | Admitting: Nephrology

## 2023-06-15 DIAGNOSIS — N1831 Chronic kidney disease, stage 3a: Secondary | ICD-10-CM

## 2023-12-03 LAB — LAB REPORT - SCANNED
EGFR: 48
TSH: 1.66 (ref 0.41–5.90)

## 2024-01-07 ENCOUNTER — Encounter: Payer: Self-pay | Admitting: Hematology and Oncology

## 2024-01-07 ENCOUNTER — Inpatient Hospital Stay: Attending: Hematology and Oncology

## 2024-01-07 ENCOUNTER — Inpatient Hospital Stay: Admitting: Hematology and Oncology

## 2024-01-07 ENCOUNTER — Other Ambulatory Visit: Payer: Self-pay | Admitting: *Deleted

## 2024-01-07 VITALS — BP 126/76 | HR 65 | Temp 98.0°F | Resp 18 | Ht 73.0 in | Wt 237.2 lb

## 2024-01-07 DIAGNOSIS — D751 Secondary polycythemia: Secondary | ICD-10-CM

## 2024-01-07 DIAGNOSIS — Z86711 Personal history of pulmonary embolism: Secondary | ICD-10-CM

## 2024-01-07 DIAGNOSIS — Z86718 Personal history of other venous thrombosis and embolism: Secondary | ICD-10-CM | POA: Diagnosis not present

## 2024-01-07 DIAGNOSIS — Z79899 Other long term (current) drug therapy: Secondary | ICD-10-CM | POA: Insufficient documentation

## 2024-01-07 DIAGNOSIS — I2699 Other pulmonary embolism without acute cor pulmonale: Secondary | ICD-10-CM

## 2024-01-07 DIAGNOSIS — Z7901 Long term (current) use of anticoagulants: Secondary | ICD-10-CM

## 2024-01-07 DIAGNOSIS — I82409 Acute embolism and thrombosis of unspecified deep veins of unspecified lower extremity: Secondary | ICD-10-CM

## 2024-01-07 DIAGNOSIS — D45 Polycythemia vera: Secondary | ICD-10-CM | POA: Diagnosis present

## 2024-01-07 DIAGNOSIS — N183 Chronic kidney disease, stage 3 unspecified: Secondary | ICD-10-CM | POA: Insufficient documentation

## 2024-01-07 LAB — CMP (CANCER CENTER ONLY)
ALT: 24 U/L (ref 0–44)
AST: 24 U/L (ref 15–41)
Albumin: 4.2 g/dL (ref 3.5–5.0)
Alkaline Phosphatase: 56 U/L (ref 38–126)
Anion gap: 4 — ABNORMAL LOW (ref 5–15)
BUN: 32 mg/dL — ABNORMAL HIGH (ref 6–20)
CO2: 25 mmol/L (ref 22–32)
Calcium: 9.1 mg/dL (ref 8.9–10.3)
Chloride: 110 mmol/L (ref 98–111)
Creatinine: 2.08 mg/dL — ABNORMAL HIGH (ref 0.61–1.24)
GFR, Estimated: 38 mL/min — ABNORMAL LOW (ref >60)
Glucose, Bld: 98 mg/dL (ref 70–99)
Potassium: 4.3 mmol/L (ref 3.5–5.1)
Sodium: 139 mmol/L (ref 135–145)
Total Bilirubin: 1 mg/dL (ref 0.0–1.2)
Total Protein: 6.7 g/dL (ref 6.5–8.1)

## 2024-01-07 LAB — CBC WITH DIFFERENTIAL (CANCER CENTER ONLY)
Abs Immature Granulocytes: 0.03 K/uL (ref 0.00–0.07)
Basophils Absolute: 0.1 K/uL (ref 0.0–0.1)
Basophils Relative: 1 %
Eosinophils Absolute: 0.3 K/uL (ref 0.0–0.5)
Eosinophils Relative: 4 %
HCT: 48.3 % (ref 39.0–52.0)
Hemoglobin: 16.4 g/dL (ref 13.0–17.0)
Immature Granulocytes: 0 %
Lymphocytes Relative: 23 %
Lymphs Abs: 1.7 K/uL (ref 0.7–4.0)
MCH: 28.6 pg (ref 26.0–34.0)
MCHC: 34 g/dL (ref 30.0–36.0)
MCV: 84.1 fL (ref 80.0–100.0)
Monocytes Absolute: 0.6 K/uL (ref 0.1–1.0)
Monocytes Relative: 8 %
Neutro Abs: 4.6 K/uL (ref 1.7–7.7)
Neutrophils Relative %: 64 %
Platelet Count: 319 K/uL (ref 150–400)
RBC: 5.74 MIL/uL (ref 4.22–5.81)
RDW: 14.8 % (ref 11.5–15.5)
WBC Count: 7.4 K/uL (ref 4.0–10.5)
nRBC: 0 % (ref 0.0–0.2)

## 2024-01-07 NOTE — Assessment & Plan Note (Addendum)
 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 Freddie has outlined in his notes extensively in 2014 He does not have lupus anticoagulant or clotting disorder He does not need long-term follow-up The goal would be for him to continue on chronic anticoagulation therapy indefinitely

## 2024-01-07 NOTE — Assessment & Plan Note (Signed)
 I suspect this is due to an element of dehydration Repeat CBC show normal hemoglobin We discussed importance of adequate hydration I have ordered NGS panel to rule out myeloproliferative disorder and we discussed risk and benefits of proceeding with this test, in view of history of unprovoked DVT and PE and he agreed I will call him with results in 2 weeks

## 2024-01-07 NOTE — Assessment & Plan Note (Signed)
 He has signs of acute on chronic renal failure I suspect there is an element of dehydration According to the patient, he only drinks enough liquid on the weekend I recommend the patient to aim for approximately 100 ounces of oral fluid daily He has been referred to see nephrologist in the past and will defer to his primary care doctor for management

## 2024-01-07 NOTE — Progress Notes (Signed)
 [facility] CONSULT NOTE  Patient Care Team: Clarice Nottingham, MD as PCP - General (Internal Medicine) Odogwu, Graylin FERNS, MD (Hematology and Oncology) Regena Feliciano LABOR, RT (Radiology)  ASSESSMENT & PLAN Secondary erythrocytosis I suspect this is due to an element of dehydration Repeat CBC show normal hemoglobin We discussed importance of adequate hydration I have ordered NGS panel to rule out myeloproliferative disorder and we discussed risk and benefits of proceeding with this test, in view of history of unprovoked DVT and PE and he agreed I will call him with results in 2 weeks  Pulmonary embolism (HCC) 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 Freddie has outlined in his notes extensively in 2014 He does not have lupus anticoagulant or clotting disorder He does not need long-term follow-up The goal would be for him to continue on chronic anticoagulation therapy indefinitely  Chronic kidney disease, stage III (moderate) (HCC) He has signs of acute on chronic renal failure I suspect there is an element of dehydration According to the patient, he only drinks enough liquid on the weekend I recommend the patient to aim for approximately 100 ounces of oral fluid daily He has been referred to see nephrologist in the past and will defer to his primary care doctor for management  Orders Placed This Encounter  Procedures   CBC with Differential (Cancer Center Only)    Standing Status:   Future    Number of Occurrences:   1    Expiration Date:   01/06/2025   CMP (Cancer Center only)    Standing Status:   Future    Number of Occurrences:   1    Expiration Date:   01/06/2025   Homocysteine    Standing Status:   Future    Number of Occurrences:   1    Expiration Date:   01/06/2025   Erythropoietin     Standing Status:   Future    Number of Occurrences:   1    Expiration Date:   01/06/2025   Almarie Bedford, MD  01/07/2024 12:52 PM  The total time spent  in the appointment was 60 minutes encounter with patients including review of chart and various tests results, discussions about plan of care and coordination of care plan   All questions were answered. The patient knows to call the clinic with any problems, questions or concerns. No barriers to learning was detected.  Almarie Bedford, MD 7/14/202512:52 PM   CHIEF COMPLAINTS/PURPOSE OF CONSULTATION:  Erythrocytosis  HISTORY OF PRESENTING ILLNESS:  Andrew Clayton 53 y.o. male is here because of elevated hemoglobin.  He was found to have abnormal CBC from recent blood work According to recent labs dated December 04, 2023, white blood cell count was 7.3, RBC count 6.3, hemoglobin 17.9, hematocrit 53.9 and platelet count of 338 Year, on October 10, 2022, his white blood cell count was 7.9, hemoglobin 16.8 and platelet count 363 He denies intermittent headaches, shortness of breath on exertion, frequent leg cramps and occasional chest pain.  He was diagnosed with DVT and PE thought to be unprovoked His DVT and PE was severe that he needed thrombectomy He has been placed on anticoagulation since then Dr. Freddie saw him in 2014 and ordered extensive workup.  Overall, he does not have lupus anticoagulant and his DVT/PE was thought to be unprovoked There is no prior diagnosis of obstructive sleep apnea.  However, the patient snored.  He was observed to have disordered breathing.  His STOP-BANG questionnaire put him at high risk for possible undiagnosed obstructive sleep apnea He does not smoke The patient works from home and is sedentary He is usually more active in the weekend During weekdays, he typically drinks about 3 cups of water a day  MEDICAL HISTORY:  Past Medical History:  Diagnosis Date   ADHD (attention deficit hyperactivity disorder)    DVT (deep venous thrombosis) (HCC) 2013   bilateral   Hypothyroidism due to Hashimoto's thyroiditis 2013   Pulmonary embolism (HCC) 2013   Superficial  thrombophlebitis of both legs 2013   Umbilical hernia    not repaired    SURGICAL HISTORY: Past Surgical History:  Procedure Laterality Date   COLONOSCOPY  2014   KNEE ARTHROSCOPY WITH ANTERIOR CRUCIATE LIGAMENT (ACL) REPAIR WITH HAMSTRING GRAFT Right 04/28/2021   Procedure: KNEE ARTHROSCOPY WITH ANTERIOR CRUCIATE LIGAMENT (ACL) REPAIR WITH HAMSTRING GRAFT;  Surgeon: Marchia Drivers, MD;  Location: [facility] ORS;  Service: Orthopedics;  Laterality: Right;   rapid lysis  04/04/2012   venogram thrombolysis    SOCIAL HISTORY: Social History   Socioeconomic History   Marital status: Married    Spouse name: Suzen   Number of children: 2   Years of education: Not on file   Highest education level: Not on file  Occupational History   Not on file  Tobacco Use   Smoking status: Never   Smokeless tobacco: Never  Vaping Use   Vaping status: Never Used  Substance and Sexual Activity   Alcohol use: No   Drug use: No   Sexual activity: Not on file  Other Topics Concern   Not on file  Social History Narrative   Not on file   Social Drivers of Health   Financial Resource Strain: Not on file  Food Insecurity: Not on file  Transportation Needs: Not on file  Physical Activity: Not on file  Stress: Not on file  Social Connections: Not on file  Intimate Partner Violence: Not on file    FAMILY HISTORY: Family History  Problem Relation Age of Onset   Pulmonary embolism Neg Hx     ALLERGIES:  has no known allergies.  MEDICATIONS:  Current Outpatient Medications  Medication Sig Dispense Refill   omeprazole (PRILOSEC) 40 MG capsule Take 40 mg by mouth daily as needed.     amphetamine -dextroamphetamine  (ADDERALL XR) 25 MG 24 hr capsule Take 50 mg by mouth every morning.      FARXIGA 10 MG TABS tablet Take 10 mg by mouth daily.     levothyroxine (SYNTHROID) 125 MCG tablet Take 125 mcg by mouth daily before breakfast.     losartan (COZAAR) 50 MG tablet Take 1 tablet (50 mg total)  by mouth daily.     tadalafil (CIALIS) 20 MG tablet Take 10-20 mg by mouth daily as needed.     XARELTO  20 MG TABS tablet TAKE 1 TABLET EVERY DAY 30 tablet 2   No current facility-administered medications for this visit.    REVIEW OF SYSTEMS:   Constitutional: Denies fevers, chills or abnormal night sweats Eyes: Denies blurriness of vision, double vision or watery eyes Ears, nose, mouth, throat, and face: Denies mucositis or sore throat Respiratory: Denies cough, dyspnea or wheezes Cardiovascular: Denies palpitation, chest discomfort or lower extremity swelling Gastrointestinal:  Denies nausea, heartburn or change in bowel habits Skin: Denies abnormal skin rashes Lymphatics: Denies new lymphadenopathy or easy bruising Neurological:Denies numbness, tingling or new weaknesses Behavioral/Psych: Mood is stable, no new changes  All  other systems were reviewed with the patient and are negative.  PHYSICAL EXAMINATION: ECOG PERFORMANCE STATUS: 0 - Asymptomatic  Vitals:   01/07/24 1132  BP: 126/76  Pulse: 65  Resp: 18  Temp: 98 F (36.7 C)  SpO2: 99%   Filed Weights   01/07/24 1132  Weight: 237 lb 3.2 oz (107.6 kg)    GENERAL:alert, no distress and comfortable SKIN: skin color, texture, turgor are normal, no rashes or significant lesions EYES: normal, conjunctiva are pink and non-injected, sclera clear OROPHARYNX:no exudate, no erythema and lips, buccal mucosa, and tongue normal  NECK: supple, thyroid normal size, non-tender, without nodularity LYMPH:  no palpable lymphadenopathy in the cervical, axillary or inguinal LUNGS: clear to auscultation and percussion with normal breathing effort HEART: regular rate & rhythm and no murmurs and no lower extremity edema ABDOMEN:abdomen soft, non-tender and normal bowel sounds Musculoskeletal:no cyanosis of digits and no clubbing  PSYCH: alert & oriented x 3 with fluent speech NEURO: no focal motor/sensory deficits  LABORATORY DATA:  I  have reviewed the data as listed Recent Results (from the past 2160 hours)  Lab report - scanned     Status: None   Collection Time: 12/03/23  7:11 AM  Result Value Ref Range   EGFR 48.0     Comment: abstracted by him   TSH 1.66 0.41 - 5.90    Comment: abstracted by him  CMP (Cancer Center only)     Status: Abnormal   Collection Time: 01/07/24 11:51 AM  Result Value Ref Range   Sodium 139 135 - 145 mmol/L   Potassium 4.3 3.5 - 5.1 mmol/L   Chloride 110 98 - 111 mmol/L   CO2 25 22 - 32 mmol/L   Glucose, Bld 98 70 - 99 mg/dL    Comment: Glucose reference range applies only to samples taken after fasting for at least 8 hours.   BUN 32 (H) 6 - 20 mg/dL   Creatinine 7.91 (H) 9.38 - 1.24 mg/dL   Calcium 9.1 8.9 - 89.6 mg/dL   Total Protein 6.7 6.5 - 8.1 g/dL   Albumin 4.2 3.5 - 5.0 g/dL   AST 24 15 - 41 U/L   ALT 24 0 - 44 U/L   Alkaline Phosphatase 56 38 - 126 U/L   Total Bilirubin 1.0 0.0 - 1.2 mg/dL   GFR, Estimated 38 (L) >60 mL/min    Comment: (NOTE) Calculated using the CKD-EPI Creatinine Equation (2021)    Anion gap 4 (L) 5 - 15    Comment: Performed at [facility], [address], [place], [place] [address]  CBC with Differential (Cancer Center Only)     Status: None   Collection Time: 01/07/24 11:51 AM  Result Value Ref Range   WBC Count 7.4 4.0 - 10.5 K/uL   RBC 5.74 4.22 - 5.81 MIL/uL   Hemoglobin 16.4 13.0 - 17.0 g/dL   HCT 51.6 60.9 - 47.9 %   MCV 84.1 80.0 - 100.0 fL   MCH 28.6 26.0 - 34.0 pg   MCHC 34.0 30.0 - 36.0 g/dL   RDW 85.1 88.4 - 84.4 %   Platelet Count 319 150 - 400 K/uL   nRBC 0.0 0.0 - 0.2 %   Neutrophils Relative % 64 %   Neutro Abs 4.6 1.7 - 7.7 K/uL   Lymphocytes Relative 23 %   Lymphs Abs 1.7 0.7 - 4.0 K/uL   Monocytes Relative 8 %   Monocytes Absolute 0.6 0.1 - 1.0  K/uL   Eosinophils Relative 4 %   Eosinophils Absolute 0.3 0.0 - 0.5 K/uL   Basophils Relative 1 %   Basophils Absolute 0.1 0.0 - 0.1 K/uL    Immature Granulocytes 0 %   Abs Immature Granulocytes 0.03 0.00 - 0.07 K/uL    Comment: Performed at [facility], [address], [place], [place] [address]

## 2024-01-08 LAB — ERYTHROPOIETIN: Erythropoietin: 2.6 m[IU]/mL (ref 2.6–18.5)

## 2024-01-08 LAB — HOMOCYSTEINE: Homocysteine: 23.9 umol/L — ABNORMAL HIGH (ref 0.0–14.5)

## 2024-01-14 ENCOUNTER — Ambulatory Visit: Payer: Self-pay | Admitting: Hematology and Oncology

## 2024-01-14 LAB — NGS JAK2 E12-15/CALR/MPL: E12-15 %: 6.95 %

## 2024-01-14 NOTE — Telephone Encounter (Signed)
-----   Message from Almarie Bedford sent at 01/14/2024  9:02 AM EDT ----- Hi, I ordered JAK 2 mutation test on him, came back abnormal. I need to review the test with him in person. Can you call and schedule appt to see me on 8/1? 20 mins. Thanks ----- Message ----- From: Rebecka, Lab [facility] [place] Sent: 01/07/2024  12:11 PM EDT To: Almarie Bedford, MD

## 2024-01-14 NOTE — Telephone Encounter (Signed)
 Left message pending call back to set up appointment for patient to discuss lab findings.

## 2024-01-16 ENCOUNTER — Encounter: Payer: Self-pay | Admitting: *Deleted

## 2024-01-16 ENCOUNTER — Telehealth: Payer: Self-pay | Admitting: *Deleted

## 2024-01-16 NOTE — Telephone Encounter (Signed)
 Phoned patient again to try to get appointment scheduled to review lab findings.  Left message pending call back.  Attempted wife's number no answer.

## 2024-01-16 NOTE — Telephone Encounter (Signed)
 Patient called back and stated that he would be out of town for two weeks.  Was able to get an opening for this Friday.  He agreed to coming in then.

## 2024-01-18 ENCOUNTER — Ambulatory Visit: Admitting: Hematology and Oncology

## 2024-01-18 ENCOUNTER — Encounter: Payer: Self-pay | Admitting: Hematology and Oncology

## 2024-01-18 VITALS — BP 127/82 | HR 66 | Temp 97.4°F | Resp 18 | Ht 73.0 in | Wt 237.0 lb

## 2024-01-18 DIAGNOSIS — D45 Polycythemia vera: Secondary | ICD-10-CM

## 2024-01-18 DIAGNOSIS — R7983 Abnormal findings of blood amino-acid level: Secondary | ICD-10-CM | POA: Diagnosis not present

## 2024-01-18 DIAGNOSIS — I2699 Other pulmonary embolism without acute cor pulmonale: Secondary | ICD-10-CM

## 2024-01-18 MED ORDER — HYDROXYUREA 500 MG PO CAPS: 500.0000 mg | ORAL_CAPSULE | Freq: Every day | ORAL | 1 refills | Status: DC

## 2024-01-18 NOTE — Progress Notes (Signed)
 [facility] OFFICE PROGRESS NOTE  Patient Care Team: Clarice Nottingham, MD as PCP - General (Internal Medicine)  Assessment & Plan Polycythemia vera [facility]) I reviewed pathology report with the patient and his wife Even though repeat CBC 11 days ago showed normal hemoglobin, due to his high risk situation with prior history of thrombosis, I recommend starting him on hydroxyurea 500 mg daily We discussed side effects to be expected Due to his planned vacation, he was started on hydroxyurea on August 1 and I will see him back within 7 to 10 days for toxicity review 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 Freddie has outlined in his notes extensively in 2014 He does not have lupus anticoagulant or clotting disorder The goal would be for him to continue on chronic anticoagulation therapy indefinitely  No orders of the defined types were placed in this encounter.    Almarie Bedford, MD  INTERVAL HISTORY: he returns for review of test results He is here accompanied by his wife I reviewed multiple test results extensively We discussed natural history of polycythemia vera, homocystinemia in association with risk of thrombosis Patient denies recent bleeding such as epistaxis, hematuria or hematochezia No recent infection We discussed test results and future plan of care as outlined above  PHYSICAL EXAMINATION: ECOG PERFORMANCE STATUS: 0 - Asymptomatic  Vitals:   01/18/24 0835  BP: 127/82  Pulse: 66  Resp: 18  Temp: (!) 97.4 F (36.3 C)  SpO2: 97%   Lab Results  Component Value Date   WBC 7.4 01/07/2024   HGB 16.4 01/07/2024   HCT 48.3  01/07/2024   MCV 84.1 01/07/2024   PLT 319 01/07/2024   Oncology History  Polycythemia vera (HCC)  01/07/2024 Initial Diagnosis   Polycythemia vera (HCC)   01/07/2024 Pathology Results   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

## 2024-01-18 NOTE — Assessment & Plan Note (Addendum)
 He has increased homocystine level that could predispose him with increased risk of thrombosis/cardiovascular disease I recommend high-dose folic acid supplement

## 2024-01-18 NOTE — Assessment & Plan Note (Addendum)
 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 Freddie has outlined in his notes extensively in 2014 He does not have lupus anticoagulant or clotting disorder The goal would be for him to continue on chronic anticoagulation therapy indefinitely

## 2024-01-18 NOTE — Assessment & Plan Note (Addendum)
 I reviewed pathology report with the patient and his wife Even though repeat CBC 11 days ago showed normal hemoglobin, due to his high risk situation with prior history of thrombosis, I recommend starting him on hydroxyurea 500 mg daily We discussed side effects to be expected Due to his planned vacation, he was started on hydroxyurea on August 1 and I will see him back within 7 to 10 days for toxicity review

## 2024-01-25 ENCOUNTER — Ambulatory Visit: Admitting: Hematology and Oncology

## 2024-02-01 ENCOUNTER — Other Ambulatory Visit: Payer: Self-pay | Admitting: Hematology and Oncology

## 2024-02-01 DIAGNOSIS — D45 Polycythemia vera: Secondary | ICD-10-CM

## 2024-02-05 ENCOUNTER — Inpatient Hospital Stay: Attending: Hematology and Oncology | Admitting: Hematology and Oncology

## 2024-02-05 ENCOUNTER — Inpatient Hospital Stay

## 2024-02-05 ENCOUNTER — Encounter: Payer: Self-pay | Admitting: Hematology and Oncology

## 2024-02-05 VITALS — BP 131/93 | HR 68 | Temp 97.5°F | Resp 18 | Ht 73.0 in | Wt 240.8 lb

## 2024-02-05 DIAGNOSIS — R7983 Abnormal findings of blood amino-acid level: Secondary | ICD-10-CM

## 2024-02-05 DIAGNOSIS — D45 Polycythemia vera: Secondary | ICD-10-CM | POA: Insufficient documentation

## 2024-02-05 LAB — CBC WITH DIFFERENTIAL/PLATELET
Abs Immature Granulocytes: 0.04 K/uL (ref 0.00–0.07)
Basophils Absolute: 0.1 K/uL (ref 0.0–0.1)
Basophils Relative: 1 %
Eosinophils Absolute: 0.3 K/uL (ref 0.0–0.5)
Eosinophils Relative: 4 %
HCT: 49.7 % (ref 39.0–52.0)
Hemoglobin: 17 g/dL (ref 13.0–17.0)
Immature Granulocytes: 1 %
Lymphocytes Relative: 21 %
Lymphs Abs: 1.7 K/uL (ref 0.7–4.0)
MCH: 28.9 pg (ref 26.0–34.0)
MCHC: 34.2 g/dL (ref 30.0–36.0)
MCV: 84.5 fL (ref 80.0–100.0)
Monocytes Absolute: 0.6 K/uL (ref 0.1–1.0)
Monocytes Relative: 7 %
Neutro Abs: 5.3 K/uL (ref 1.7–7.7)
Neutrophils Relative %: 66 %
Platelets: 334 K/uL (ref 150–400)
RBC: 5.88 MIL/uL — ABNORMAL HIGH (ref 4.22–5.81)
RDW: 15.5 % (ref 11.5–15.5)
WBC: 8 K/uL (ref 4.0–10.5)
nRBC: 0 % (ref 0.0–0.2)

## 2024-02-05 NOTE — Assessment & Plan Note (Addendum)
 He will continue folic acid supplement

## 2024-02-05 NOTE — Assessment & Plan Note (Addendum)
 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

## 2024-02-05 NOTE — Progress Notes (Signed)
  [facility] OFFICE PROGRESS NOTE  Patient Care Team: Clarice Nottingham, MD as PCP - General (Internal Medicine)  Assessment & Plan Polycythemia vera [facility]) The patient was diagnosed with polycythemia vera in July 2025.  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    Almarie Bedford, MD  INTERVAL HISTORY: he returns for surveillance follow-up for myeloproliferative disorder/neoplasm Patient denies recent bleeding such as epistaxis, hematuria or hematochezia No recent infection We reviewed medication list and discussed medication changes We discussed test results and future plan of care as outlined above  PHYSICAL EXAMINATION: ECOG PERFORMANCE STATUS: 0 - Asymptomatic  Vitals:   02/05/24 1455  BP: (!) 131/93  Pulse: 68  Resp: 18  Temp: (!) 97.5 F (36.4 C)  SpO2: 97%   Lab Results  Component Value Date   WBC 8.0 02/05/2024   HGB 17.0 02/05/2024   HCT 49.7 02/05/2024   MCV 84.5 02/05/2024   PLT 334 02/05/2024

## 2024-02-22 ENCOUNTER — Encounter: Payer: Self-pay | Admitting: Hematology and Oncology

## 2024-02-22 ENCOUNTER — Inpatient Hospital Stay

## 2024-02-22 ENCOUNTER — Inpatient Hospital Stay: Admitting: Hematology and Oncology

## 2024-02-22 VITALS — BP 111/82 | HR 90 | Temp 97.6°F | Resp 18 | Ht 73.0 in | Wt 234.4 lb

## 2024-02-22 DIAGNOSIS — D45 Polycythemia vera: Secondary | ICD-10-CM

## 2024-02-22 LAB — CBC WITH DIFFERENTIAL/PLATELET
Abs Immature Granulocytes: 0.03 K/uL (ref 0.00–0.07)
Basophils Absolute: 0.1 K/uL (ref 0.0–0.1)
Basophils Relative: 1 %
Eosinophils Absolute: 0.3 K/uL (ref 0.0–0.5)
Eosinophils Relative: 3 %
HCT: 52.3 % — ABNORMAL HIGH (ref 39.0–52.0)
Hemoglobin: 17.9 g/dL — ABNORMAL HIGH (ref 13.0–17.0)
Immature Granulocytes: 0 %
Lymphocytes Relative: 23 %
Lymphs Abs: 2.1 K/uL (ref 0.7–4.0)
MCH: 29.7 pg (ref 26.0–34.0)
MCHC: 34.2 g/dL (ref 30.0–36.0)
MCV: 86.7 fL (ref 80.0–100.0)
Monocytes Absolute: 0.8 K/uL (ref 0.1–1.0)
Monocytes Relative: 8 %
Neutro Abs: 6.1 K/uL (ref 1.7–7.7)
Neutrophils Relative %: 65 %
Platelets: 374 K/uL (ref 150–400)
RBC: 6.03 MIL/uL — ABNORMAL HIGH (ref 4.22–5.81)
RDW: 18.6 % — ABNORMAL HIGH (ref 11.5–15.5)
WBC: 9.3 K/uL (ref 4.0–10.5)
nRBC: 0 % (ref 0.0–0.2)

## 2024-02-22 MED ORDER — HYDROXYUREA 500 MG PO CAPS: ORAL_CAPSULE | ORAL | 1 refills | Status: DC

## 2024-02-22 NOTE — Progress Notes (Signed)
  [facility] OFFICE PROGRESS NOTE  Patient Care Team: Clarice Nottingham, MD as PCP - General (Internal Medicine)  Assessment & Plan Polycythemia vera [facility]) The patient was diagnosed with polycythemia vera in July 2025.  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    Almarie Bedford, MD  INTERVAL HISTORY: he returns for surveillance follow-up for myeloproliferative disorder/neoplasm Patient denies recent bleeding such as epistaxis, hematuria or hematochezia No recent infection He denies missing doses We reviewed medication list and discussed medication changes We discussed test results and future plan of care as outlined above  PHYSICAL EXAMINATION: ECOG PERFORMANCE STATUS: 0 - Asymptomatic  Vitals:   02/22/24 1527  BP: 111/82  Pulse: 90  Resp: 18  Temp: 97.6 F (36.4 C)  SpO2: 98%   Lab Results  Component Value Date   WBC 9.3 02/22/2024   HGB 17.9 (H) 02/22/2024   HCT 52.3 (H) 02/22/2024   MCV 86.7 02/22/2024   PLT 374 02/22/2024

## 2024-02-22 NOTE — Assessment & Plan Note (Addendum)
 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

## 2024-03-07 ENCOUNTER — Inpatient Hospital Stay

## 2024-03-07 ENCOUNTER — Inpatient Hospital Stay: Attending: Hematology and Oncology

## 2024-03-07 ENCOUNTER — Encounter: Payer: Self-pay | Admitting: Hematology and Oncology

## 2024-03-07 ENCOUNTER — Inpatient Hospital Stay (HOSPITAL_BASED_OUTPATIENT_CLINIC_OR_DEPARTMENT_OTHER): Admitting: Hematology and Oncology

## 2024-03-07 VITALS — BP 105/68 | HR 76 | Temp 98.2°F | Resp 18 | Ht 73.0 in | Wt 237.7 lb

## 2024-03-07 DIAGNOSIS — D45 Polycythemia vera: Secondary | ICD-10-CM

## 2024-03-07 LAB — CBC WITH DIFFERENTIAL/PLATELET
Abs Immature Granulocytes: 0.02 K/uL (ref 0.00–0.07)
Basophils Absolute: 0.1 K/uL (ref 0.0–0.1)
Basophils Relative: 2 %
Eosinophils Absolute: 0.2 K/uL (ref 0.0–0.5)
Eosinophils Relative: 3 %
HCT: 50.3 % (ref 39.0–52.0)
Hemoglobin: 17.5 g/dL — ABNORMAL HIGH (ref 13.0–17.0)
Immature Granulocytes: 0 %
Lymphocytes Relative: 26 %
Lymphs Abs: 1.9 K/uL (ref 0.7–4.0)
MCH: 30.8 pg (ref 26.0–34.0)
MCHC: 34.8 g/dL (ref 30.0–36.0)
MCV: 88.4 fL (ref 80.0–100.0)
Monocytes Absolute: 0.7 K/uL (ref 0.1–1.0)
Monocytes Relative: 10 %
Neutro Abs: 4.5 K/uL (ref 1.7–7.7)
Neutrophils Relative %: 59 %
Platelets: 368 K/uL (ref 150–400)
RBC: 5.69 MIL/uL (ref 4.22–5.81)
RDW: 19.5 % — ABNORMAL HIGH (ref 11.5–15.5)
WBC: 7.5 K/uL (ref 4.0–10.5)
nRBC: 0 % (ref 0.0–0.2)

## 2024-03-07 MED ORDER — HYDROXYUREA 500 MG PO CAPS: ORAL_CAPSULE | ORAL | 1 refills | Status: DC

## 2024-03-07 NOTE — Assessment & Plan Note (Addendum)
 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 

## 2024-03-07 NOTE — Progress Notes (Signed)
 [facility] OFFICE PROGRESS NOTE  Patient Care Team: Clarice Nottingham, MD as PCP - General (Internal Medicine)  Assessment & Plan Polycythemia vera [facility]) The patient was diagnosed with polycythemia vera in July 2025.  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    Almarie Bedford, MD  INTERVAL HISTORY: he returns for surveillance follow-up for myeloproliferative disorder/neoplasm Patient denies recent bleeding such as epistaxis, hematuria or hematochezia No recent infection We reviewed medication list and discussed medication changes We discussed test results and future plan of care as outlined above  PHYSICAL EXAMINATION: ECOG PERFORMANCE STATUS: 0 - Asymptomatic  Vitals:   03/07/24 1455  BP: 105/68  Pulse: 76  Resp: 18  Temp: 98.2 F (36.8 C)  SpO2: 98%   Lab Results  Component Value Date   WBC 7.5 03/07/2024   HGB 17.5 (H) 03/07/2024   HCT 50.3 03/07/2024   MCV 88.4 03/07/2024   PLT 368 03/07/2024

## 2024-04-04 ENCOUNTER — Encounter: Payer: Self-pay | Admitting: Hematology and Oncology

## 2024-04-04 ENCOUNTER — Inpatient Hospital Stay: Admitting: Hematology and Oncology

## 2024-04-04 ENCOUNTER — Inpatient Hospital Stay: Attending: Hematology and Oncology

## 2024-04-04 VITALS — BP 138/78 | HR 77 | Temp 97.7°F | Resp 18 | Ht 73.0 in | Wt 235.8 lb

## 2024-04-04 DIAGNOSIS — D45 Polycythemia vera: Secondary | ICD-10-CM | POA: Insufficient documentation

## 2024-04-04 LAB — CBC WITH DIFFERENTIAL/PLATELET
Abs Immature Granulocytes: 0.03 K/uL (ref 0.00–0.07)
Basophils Absolute: 0.1 K/uL (ref 0.0–0.1)
Basophils Relative: 1 %
Eosinophils Absolute: 0.2 K/uL (ref 0.0–0.5)
Eosinophils Relative: 3 %
HCT: 48.1 % (ref 39.0–52.0)
Hemoglobin: 17 g/dL (ref 13.0–17.0)
Immature Granulocytes: 0 %
Lymphocytes Relative: 25 %
Lymphs Abs: 1.9 K/uL (ref 0.7–4.0)
MCH: 32 pg (ref 26.0–34.0)
MCHC: 35.3 g/dL (ref 30.0–36.0)
MCV: 90.4 fL (ref 80.0–100.0)
Monocytes Absolute: 0.6 K/uL (ref 0.1–1.0)
Monocytes Relative: 8 %
Neutro Abs: 4.7 K/uL (ref 1.7–7.7)
Neutrophils Relative %: 63 %
Platelets: 295 K/uL (ref 150–400)
RBC: 5.32 MIL/uL (ref 4.22–5.81)
RDW: 19.7 % — ABNORMAL HIGH (ref 11.5–15.5)
WBC: 7.6 K/uL (ref 4.0–10.5)
nRBC: 0 % (ref 0.0–0.2)

## 2024-04-04 NOTE — Assessment & Plan Note (Addendum)
 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 

## 2024-04-04 NOTE — Progress Notes (Signed)
 [facility] OFFICE PROGRESS NOTE  Patient Care Team: Clarice Nottingham, MD as PCP - General (Internal Medicine)  Assessment & Plan Polycythemia vera [facility]) The patient was diagnosed with polycythemia vera in July 2025.  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    Almarie Bedford, MD  INTERVAL HISTORY: he returns for surveillance follow-up for myeloproliferative disorder/neoplasm Patient denies recent bleeding such as epistaxis, hematuria or hematochezia No recent infection We reviewed medication list and discussed medication changes We discussed test results and future plan of care as outlined above  PHYSICAL EXAMINATION: ECOG PERFORMANCE STATUS: 0 - Asymptomatic  There were no vitals filed for this visit. Lab Results  Component Value Date   WBC 7.6 04/04/2024   HGB 17.0 04/04/2024   HCT 48.1 04/04/2024   MCV 90.4 04/04/2024   PLT 295 04/04/2024

## 2024-06-06 ENCOUNTER — Inpatient Hospital Stay

## 2024-06-06 ENCOUNTER — Inpatient Hospital Stay: Admitting: Hematology and Oncology

## 2024-06-09 ENCOUNTER — Other Ambulatory Visit: Payer: Self-pay | Admitting: Hematology and Oncology

## 2024-07-04 ENCOUNTER — Inpatient Hospital Stay: Admitting: Hematology and Oncology

## 2024-07-04 ENCOUNTER — Inpatient Hospital Stay: Attending: Hematology and Oncology

## 2024-07-04 ENCOUNTER — Encounter: Payer: Self-pay | Admitting: Hematology and Oncology

## 2024-07-04 VITALS — BP 116/77 | HR 79 | Temp 97.8°F | Resp 18 | Ht 73.0 in | Wt 239.6 lb

## 2024-07-04 DIAGNOSIS — Z86711 Personal history of pulmonary embolism: Secondary | ICD-10-CM | POA: Diagnosis not present

## 2024-07-04 DIAGNOSIS — I2699 Other pulmonary embolism without acute cor pulmonale: Secondary | ICD-10-CM | POA: Diagnosis not present

## 2024-07-04 DIAGNOSIS — D45 Polycythemia vera: Secondary | ICD-10-CM | POA: Diagnosis not present

## 2024-07-04 DIAGNOSIS — Z86718 Personal history of other venous thrombosis and embolism: Secondary | ICD-10-CM | POA: Insufficient documentation

## 2024-07-04 DIAGNOSIS — Z7901 Long term (current) use of anticoagulants: Secondary | ICD-10-CM | POA: Insufficient documentation

## 2024-07-04 LAB — CBC WITH DIFFERENTIAL/PLATELET
Abs Immature Granulocytes: 0.01 K/uL (ref 0.00–0.07)
Basophils Absolute: 0.1 K/uL (ref 0.0–0.1)
Basophils Relative: 1 %
Eosinophils Absolute: 0.2 K/uL (ref 0.0–0.5)
Eosinophils Relative: 3 %
HCT: 45.1 % (ref 39.0–52.0)
Hemoglobin: 16.2 g/dL (ref 13.0–17.0)
Immature Granulocytes: 0 %
Lymphocytes Relative: 27 %
Lymphs Abs: 1.7 K/uL (ref 0.7–4.0)
MCH: 35.7 pg — ABNORMAL HIGH (ref 26.0–34.0)
MCHC: 35.9 g/dL (ref 30.0–36.0)
MCV: 99.3 fL (ref 80.0–100.0)
Monocytes Absolute: 0.6 K/uL (ref 0.1–1.0)
Monocytes Relative: 10 %
Neutro Abs: 3.7 K/uL (ref 1.7–7.7)
Neutrophils Relative %: 59 %
Platelets: 279 K/uL (ref 150–400)
RBC: 4.54 MIL/uL (ref 4.22–5.81)
RDW: 14.3 % (ref 11.5–15.5)
WBC: 6.3 K/uL (ref 4.0–10.5)
nRBC: 0 % (ref 0.0–0.2)

## 2024-07-04 NOTE — Progress Notes (Signed)
 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 Due to high risk of thrombosis, he started on hydroxyurea  daily since August 2025 End of August 2025, the dose of hydroxyurea  was modified to 500 mg daily except on Saturdays he will take 1000 mg CBC today is completely normal I plan to space out interval between visits to 3 months He will continue current dose of Hydrea  We discussed situations such as infection or severe stress/weight loss that could cause sudden drop in CBC but in the absence of those symptoms, I anticipate he will stay stable on current dose of hydroxyurea  Pulmonary embolism, unspecified chronicity, unspecified pulmonary embolism type, unspecified whether acute cor pulmonale present [facility]) 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 Freddie has outlined in his notes extensively in 2014 He does not have lupus anticoagulant or clotting disorder The goal would be for him to continue on chronic anticoagulation therapy indefinitely  No orders of the defined types were placed in this encounter.    Almarie Bedford, MD  INTERVAL HISTORY: he returns for surveillance follow-up for myeloproliferative disorder/neoplasm Patient denies recent bleeding such as epistaxis, hematuria or hematochezia No recent infection We reviewed medication list and discussed medication changes We discussed test results and future plan of care as outlined above  PHYSICAL EXAMINATION: ECOG PERFORMANCE STATUS: 0 - Asymptomatic  Vitals:   07/04/24 1205  BP: 116/77  Pulse: 79  Resp: 18  Temp: 97.8 F (36.6 C)  SpO2: 97%   Lab Results  Component Value Date   WBC 6.3 07/04/2024   HGB 16.2 07/04/2024   HCT 45.1 07/04/2024   MCV 99.3  07/04/2024   PLT 279 07/04/2024

## 2024-07-04 NOTE — Assessment & Plan Note (Addendum)
 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 Freddie has outlined in his notes extensively in 2014 He does not have lupus anticoagulant or clotting disorder The goal would be for him to continue on chronic anticoagulation therapy indefinitely

## 2024-07-04 NOTE — Assessment & Plan Note (Addendum)
 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 

## 2024-10-06 ENCOUNTER — Inpatient Hospital Stay: Admitting: Hematology and Oncology

## 2024-10-06 ENCOUNTER — Inpatient Hospital Stay
